# Patient Record
Sex: Male | Born: 1980
Health system: Southern US, Community
[De-identification: ages and names within clinical notes are randomized; demographics above are authoritative.]

## PROBLEM LIST (undated history)

## (undated) DIAGNOSIS — K859 Acute pancreatitis without necrosis or infection, unspecified: Secondary | ICD-10-CM

## (undated) DIAGNOSIS — R002 Palpitations: Secondary | ICD-10-CM

## (undated) DIAGNOSIS — K219 Gastro-esophageal reflux disease without esophagitis: Secondary | ICD-10-CM

## (undated) DIAGNOSIS — F419 Anxiety disorder, unspecified: Secondary | ICD-10-CM

## (undated) DIAGNOSIS — E785 Hyperlipidemia, unspecified: Secondary | ICD-10-CM

## (undated) DIAGNOSIS — T7840XA Allergy, unspecified, initial encounter: Secondary | ICD-10-CM

## (undated) DIAGNOSIS — J3089 Other allergic rhinitis: Secondary | ICD-10-CM

## (undated) DIAGNOSIS — D229 Melanocytic nevi, unspecified: Secondary | ICD-10-CM

## (undated) DIAGNOSIS — R42 Dizziness and giddiness: Secondary | ICD-10-CM

## (undated) DIAGNOSIS — D039 Melanoma in situ, unspecified: Secondary | ICD-10-CM

## (undated) DIAGNOSIS — R079 Chest pain, unspecified: Secondary | ICD-10-CM

## (undated) HISTORY — DX: Anxiety disorder, unspecified: F41.9

## (undated) HISTORY — DX: Allergy, unspecified, initial encounter: T78.40XA

## (undated) HISTORY — DX: Acute pancreatitis without necrosis or infection, unspecified: K85.90

## (undated) HISTORY — DX: Palpitations: R00.2

## (undated) HISTORY — PX: WISDOM TOOTH EXTRACTION: SHX21

## (undated) HISTORY — DX: Chest pain, unspecified: R07.9

## (undated) HISTORY — DX: Gastro-esophageal reflux disease without esophagitis: K21.9

## (undated) HISTORY — DX: Other allergic rhinitis: J30.89

## (undated) HISTORY — DX: Dizziness and giddiness: R42

## (undated) HISTORY — DX: Hyperlipidemia, unspecified: E78.5

---
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
## 2004-12-30 ENCOUNTER — Emergency Department (HOSPITAL_COMMUNITY): Admission: EM | Admit: 2004-12-30 | Discharge: 2004-12-30 | Payer: Self-pay | Admitting: Emergency Medicine

## 2005-08-18 ENCOUNTER — Ambulatory Visit: Payer: Self-pay | Admitting: Family Medicine

## 2007-01-06 ENCOUNTER — Ambulatory Visit (HOSPITAL_COMMUNITY): Admission: RE | Admit: 2007-01-06 | Discharge: 2007-01-06 | Payer: Self-pay | Admitting: Orthopedic Surgery

## 2008-08-02 ENCOUNTER — Encounter: Payer: Self-pay | Admitting: Family Medicine

## 2009-07-11 ENCOUNTER — Ambulatory Visit: Payer: Self-pay | Admitting: Family Medicine

## 2009-09-10 ENCOUNTER — Ambulatory Visit: Payer: Self-pay | Admitting: Family Medicine

## 2009-10-22 ENCOUNTER — Encounter: Payer: Self-pay | Admitting: Family Medicine

## 2010-04-15 ENCOUNTER — Encounter: Payer: Self-pay | Admitting: Family Medicine

## 2010-06-11 NOTE — Letter (Signed)
Summary: Records from [facility] 2004 - 2010  Records from [facility] 2004 - 2010   Imported By: Maryln Gottron 07/23/2009 11:00:32  _____________________________________________________________________  External Attachment:    Type:   Image     Comment:   External Document

## 2010-06-11 NOTE — Assessment & Plan Note (Signed)
Summary: ear issues//ccm   Vital Signs:  Patient profile:   30 year old male Weight:      179 pounds BMI:     24.36 Temp:     98.3 degrees F oral Pulse rate:   84 / minute Pulse rhythm:   regular BP sitting:   140 / 70  (left arm) Cuff size:   regular  Vitals Entered By: Raechel Ache, RN (Sep 10, 2009 4:16 PM) CC: C/o pulsing noise R ear.   History of Present Illness: Here complaining of 3 days of intermittent sounds in his right ear that sound like heartbeats. There is no pain or discomfort. No change in hearing. He admits to some sinus pressure and head congestion for the past month. He is taking Xyzal regularly but does not take any decongestants. No dizziness. No cough or ST or HA or fever.   Allergies: No Known Drug Allergies  Past History:  Past Medical History: Reviewed history from 07/11/2009 and no changes required. Chickenpox Allergic rhinitis, sees Dr. Sidney Ace intermittent musculoskeletal chest pains  Past Surgical History: Reviewed history from 07/11/2009 and no changes required. Wisdom teeth removed - 1997  Review of Systems  The patient denies anorexia, fever, weight loss, weight gain, vision loss, decreased hearing, hoarseness, chest pain, syncope, dyspnea on exertion, peripheral edema, prolonged cough, headaches, hemoptysis, abdominal pain, melena, hematochezia, severe indigestion/heartburn, hematuria, incontinence, genital sores, muscle weakness, suspicious skin lesions, transient blindness, difficulty walking, depression, unusual weight change, abnormal bleeding, enlarged lymph nodes, angioedema, breast masses, and testicular masses.    Physical Exam  General:  Well-developed,well-nourished,in no acute distress; alert,appropriate and cooperative throughout examination Head:  Normocephalic and atraumatic without obvious abnormalities. No apparent alopecia or balding. Eyes:  No corneal or conjunctival inflammation noted. EOMI. Perrla. Funduscopic  exam benign, without hemorrhages, exudates or papilledema. Vision grossly normal. Ears:  External ear exam shows no significant lesions or deformities.  Otoscopic examination reveals clear canals, tympanic membranes are intact bilaterally without bulging, retraction, inflammation or discharge. Hearing is grossly normal bilaterally. Nose:  External nasal examination shows no deformity or inflammation. Nasal mucosa are pink and moist without lesions or exudates. Mouth:  Oral mucosa and oropharynx without lesions or exudates.  Teeth in good repair. Neck:  No deformities, masses, or tenderness noted. Lungs:  Normal respiratory effort, chest expands symmetrically. Lungs are clear to auscultation, no crackles or wheezes. Heart:  Normal rate and regular rhythm. S1 and S2 normal without gallop, murmur, click, rub or other extra sounds.   Impression & Recommendations:  Problem # 1:  EUSTACHIAN TUBE DYSFUNCTION (ICD-381.81)  Problem # 2:  ALLERGIC RHINITIS (ICD-477.9)  His updated medication list for this problem includes:    Xyzal 5 Mg Tabs (Levocetirizine dihydrochloride) ..... Once daily  Complete Medication List: 1)  Xyzal 5 Mg Tabs (Levocetirizine dihydrochloride) .... Once daily  Patient Instructions: 1)  This is simply amplified sounds in the ear from the pulsations of the temporal artery branches. This is a result of head congestion, related to his allergies. Suggested he add Mucinex and /or Sudafed to the Xyzal daily unitl the pollen dies down.  2)  Please schedule a follow-up appointment as needed .

## 2010-06-11 NOTE — Letter (Signed)
Summary: [facility]  [facility]   Imported By: Maryln Gottron 10/31/2009 13:31:26  _____________________________________________________________________  External Attachment:    Type:   Image     Comment:   External Document

## 2010-06-11 NOTE — Assessment & Plan Note (Signed)
Summary: PT TO RE-ESTABLISH/CJR   Vital Signs:  Patient profile:   30 year old male Height:      72 inches Weight:      178 pounds BMI:     24.23 Temp:     98.4 degrees F oral Pulse rate:   68 / minute BP sitting:   132 / 80  (left arm) Cuff size:   regular  Vitals Entered By: Alfred Levins, CMA (July 11, 2009 10:43 AM) CC: re-establish, chest hurts when he inhales, lt earache   History of Present Illness: 30 yr old male to re-establish with [place] after an absence of about 4 years. He had seen Dr. Rosezetta Schlatter for awhile at [facility]. For about 10 years now he has episodes of mild sharp chest pains which last anywhere from several days to a week at a time. he gets these spells about 2 or 3 times a year. He only notices these when he takes a deep breath at rest or when he is lyiong quietly in bed. These are not related to exertion in any way. No cough or SOB or nausea or sweats or heartburn. Most of these pains occur over the spine between the shoulder blades or along the sternum. He may take some Advil for it or maybe nothing. Today he feels fine. He actually saw [facility] for this 2 years ago, and they worked it up with a CXR and an MRI of his spine. Nothing showed up apparently. The other question he has for me today is about an occasional clicking or swishing sound he gets in his left ear. There is no pain and  no drainage, and it does not affect his hearing. he ses Dr. Norco Callas for allergies, and he takes Xyzal and allergy shots for this.   Preventive Screening-Counseling & Management  Alcohol-Tobacco     Smoking Status: never      Drug Use:  no.    Current Medications (verified): 1)  Xyzal 5 Mg  Tabs (Levocetirizine Dihydrochloride) .... Once Daily  Allergies (verified): No Known Drug Allergies  Past History:  Past Medical History: Chickenpox Allergic rhinitis, sees Dr. Sidney Ace intermittent musculoskeletal chest pains  Past Surgical History: Wisdom teeth  removed - 1997  Family History: Reviewed history and no changes required. Graves' disease Family History of Asthma Family History of Prostate CA-Father  Social History: Reviewed history and no changes required. Married Never Smoked Alcohol use-yes Drug use-no Smoking Status:  never Drug Use:  no  Review of Systems  The patient denies anorexia, fever, weight loss, weight gain, vision loss, decreased hearing, hoarseness, syncope, dyspnea on exertion, peripheral edema, prolonged cough, headaches, hemoptysis, abdominal pain, melena, hematochezia, severe indigestion/heartburn, hematuria, incontinence, genital sores, muscle weakness, suspicious skin lesions, transient blindness, difficulty walking, depression, unusual weight change, abnormal bleeding, enlarged lymph nodes, angioedema, breast masses, and testicular masses.    Physical Exam  General:  Well-developed,well-nourished,in no acute distress; alert,appropriate and cooperative throughout examination Head:  Normocephalic and atraumatic without obvious abnormalities. No apparent alopecia or balding. Eyes:  No corneal or conjunctival inflammation noted. EOMI. Perrla. Funduscopic exam benign, without hemorrhages, exudates or papilledema. Vision grossly normal. Ears:  External ear exam shows no significant lesions or deformities.  Otoscopic examination reveals clear canals, tympanic membranes are intact bilaterally without bulging, retraction, inflammation or discharge. Hearing is grossly normal bilaterally. Nose:  External nasal examination shows no deformity or inflammation. Nasal mucosa are pink and moist without lesions or exudates. Mouth:  Oral mucosa  and oropharynx without lesions or exudates.  Teeth in good repair. Neck:  No deformities, masses, or tenderness noted. Chest Wall:  No deformities, masses, tenderness or gynecomastia noted. Lungs:  Normal respiratory effort, chest expands symmetrically. Lungs are clear to auscultation,  no crackles or wheezes. Heart:  Normal rate and regular rhythm. S1 and S2 normal without gallop, murmur, click, rub or other extra sounds. Msk:  No deformity or scoliosis noted of thoracic or lumbar spine.     Impression & Recommendations:  Problem # 1:  CHEST WALL PAIN, ACUTE (ICD-786.52)  Problem # 2:  EUSTACHIAN TUBE DYSFUNCTION (ICD-381.81)  Complete Medication List: 1)  Xyzal 5 Mg Tabs (Levocetirizine dihydrochloride) .... Once daily  Patient Instructions: 1)  I reassured him that the chest wall pain is most certainly benign and that I do not think any further workup is warranted. Suggested he start taking Aleeve two times a day on a regular basis for the next 3 months to see if this helps. As for the ear symptoms, I  also explained this was benign and that decongestants can help. In addition to his antihistamine, he could try Mucinex or Sudafed.  2)  Please schedule a follow-up appointment as needed .

## 2010-06-13 NOTE — Letter (Signed)
Summary: [facility]  [facility]   Imported By: Maryln Gottron 05/07/2010 12:08:42  _____________________________________________________________________  External Attachment:    Type:   Image     Comment:   External Document

## 2010-07-05 ENCOUNTER — Encounter: Payer: Self-pay | Admitting: Family Medicine

## 2010-07-05 ENCOUNTER — Ambulatory Visit (INDEPENDENT_AMBULATORY_CARE_PROVIDER_SITE_OTHER): Payer: BC Managed Care – PPO | Admitting: Family Medicine

## 2010-07-05 VITALS — BP 120/80 | HR 84 | Temp 98.8°F

## 2010-07-05 DIAGNOSIS — J329 Chronic sinusitis, unspecified: Secondary | ICD-10-CM

## 2010-07-05 MED ORDER — AZITHROMYCIN 250 MG PO TABS: ORAL_TABLET | ORAL | Status: AC

## 2010-07-05 NOTE — Progress Notes (Signed)
  Subjective:    Patient ID: Raymond Bryant, male    DOB: 1981-04-08, 30 y.o.   MRN: [id number]  HPI Here for 2 weeks of sinus pressure, left ear pain, PND, and as dry cough. No fever. On Xyzal and Mucinex/   Review of Systems  Constitutional: Negative.   HENT: Positive for ear pain, congestion, postnasal drip and sinus pressure.   Eyes: Negative.   Respiratory: Positive for cough.        Objective:   Physical Exam  Constitutional: He appears well-developed and well-nourished.  HENT:  Head: Normocephalic and atraumatic.  Right Ear: External ear normal.  Left Ear: External ear normal.  Nose: Nose normal.  Mouth/Throat: Oropharynx is clear and moist. No oropharyngeal exudate.  Eyes: Conjunctivae and EOM are normal. Pupils are equal, round, and reactive to light.  Cardiovascular: Normal rate, regular rhythm, normal heart sounds and intact distal pulses.  Exam reveals no gallop and no friction rub.   No murmur heard. Lymphadenopathy:    He has no cervical adenopathy.          Assessment & Plan:  He has a mild sinusitis on top of his usual allergies.

## 2010-10-02 ENCOUNTER — Encounter: Payer: Self-pay | Admitting: Family Medicine

## 2010-10-02 ENCOUNTER — Ambulatory Visit (INDEPENDENT_AMBULATORY_CARE_PROVIDER_SITE_OTHER): Payer: BC Managed Care – PPO | Admitting: Family Medicine

## 2010-10-02 VITALS — BP 110/66 | HR 82 | Temp 98.4°F | Resp 14 | Wt 169.0 lb

## 2010-10-02 DIAGNOSIS — K859 Acute pancreatitis without necrosis or infection, unspecified: Secondary | ICD-10-CM

## 2010-10-02 LAB — AMYLASE: Amylase: 182 U/L — ABNORMAL HIGH (ref 27–131)

## 2010-10-02 NOTE — Progress Notes (Signed)
  Subjective:    Patient ID: Raymond Bryant, male    DOB: Feb 20, 1981, 30 y.o.   MRN: [id number]  HPI Here to follow up an episode of alcoholic pancreatitis last week. He had been drinking a lot more alcohol than [place] is used to on [place], and he began to experience abdominal pain and non-stop vomiting. The vomitus was brown in color, not red or black. He did not pass out but was very intoxicated. His wife took him to [facility] ER in [place], where he got IV fluids. Labs were all normal except for a very high lipase at 2103 and a high amylase (no level was recorded on the sheet he shows me). He was sent home, and he has done very well since then. He slowly advanced his diet over the next few days until now he is eating a regular diet with no nausea or pain at all. Prior to this episode he had not had any abdominal symptoms, no heartburn, etc.    Review of Systems  Constitutional: Negative.   Respiratory: Negative.   Cardiovascular: Negative.   Gastrointestinal: Positive for nausea, vomiting and abdominal pain.       Objective:   Physical Exam  Constitutional: He appears well-developed and well-nourished.  Cardiovascular: Normal rate, regular rhythm, normal heart sounds and intact distal pulses.   Pulmonary/Chest: Effort normal and breath sounds normal.  Abdominal: Soft. Bowel sounds are normal. He exhibits no distension and no mass. There is no tenderness. There is no rebound and no guarding.          Assessment & Plan:  This was an episode of alcoholic pancreatitis that has apparently resolved. We will recheck some enzyme levels today

## 2010-10-08 NOTE — Progress Notes (Signed)
Pt informed

## 2010-10-23 ENCOUNTER — Ambulatory Visit (INDEPENDENT_AMBULATORY_CARE_PROVIDER_SITE_OTHER): Payer: BC Managed Care – PPO | Admitting: Family Medicine

## 2010-10-23 ENCOUNTER — Encounter: Payer: Self-pay | Admitting: Family Medicine

## 2010-10-23 VITALS — BP 140/80 | Temp 99.1°F | Wt 168.0 lb

## 2010-10-23 DIAGNOSIS — R5383 Other fatigue: Secondary | ICD-10-CM

## 2010-10-23 DIAGNOSIS — F3289 Other specified depressive episodes: Secondary | ICD-10-CM

## 2010-10-23 DIAGNOSIS — R5381 Other malaise: Secondary | ICD-10-CM

## 2010-10-23 DIAGNOSIS — F32A Depression, unspecified: Secondary | ICD-10-CM

## 2010-10-23 DIAGNOSIS — F329 Major depressive disorder, single episode, unspecified: Secondary | ICD-10-CM

## 2010-10-23 NOTE — Patient Instructions (Signed)
Get back to regular exercise as much as possible. Follow up with Dr Clent Ridges for any progressive depression symptoms.

## 2010-10-23 NOTE — Progress Notes (Signed)
  Subjective:    Patient ID: Raymond Bryant, male    DOB: 1980/10/06, 30 y.o.   MRN: [id number]  HPI Patient seen with initial complaint of anxiety symptoms. He relates newborn son about 9 months ago. Since then has had some intermittent relatively mild depression symptoms that come and go. Big change in life routine. Has given up several hobbies. Not exercising consistently. Increased fatigue. Generally getting plenty of sleep. No change of appetite. Decreased time and interest in hobbies. Occasional difficulty concentrating. No appetite or weight changes.  Mother has hypothyroidism. He is concerned this should be screened. No constipation issues or any cold intolerance. No prior history of depression. No specific work stressors.   Review of Systems  Constitutional: Negative for appetite change and unexpected weight change.  Respiratory: Negative for shortness of breath.   Cardiovascular: Negative for chest pain.  Neurological: Negative for headaches.  Psychiatric/Behavioral: Positive for dysphoric mood. Negative for suicidal ideas, sleep disturbance, self-injury and agitation.       Objective:   Physical Exam  Constitutional: He is oriented to person, place, and time. He appears well-developed and well-nourished.  Cardiovascular: Normal rate and regular rhythm.   Pulmonary/Chest: Effort normal and breath sounds normal. No respiratory distress. He has no wheezes.  Musculoskeletal: He exhibits no edema.  Neurological: He is alert and oriented to person, place, and time.  Psychiatric: He has a normal mood and affect. His behavior is normal.          Assessment & Plan:  Depressed mood. Doubt major depressive episode. Check TSH. Get back to regular exercise routine. Consider counseling. If anxiety or depressive symptoms worsen consider sertraline

## 2010-10-24 LAB — TSH: TSH: 0.54 u[IU]/mL (ref 0.35–5.50)

## 2010-10-25 NOTE — Progress Notes (Signed)
Quick Note:  Pt informed ______ 

## 2010-11-28 ENCOUNTER — Encounter: Payer: Self-pay | Admitting: Family Medicine

## 2010-11-28 ENCOUNTER — Ambulatory Visit (INDEPENDENT_AMBULATORY_CARE_PROVIDER_SITE_OTHER): Payer: BC Managed Care – PPO | Admitting: Family Medicine

## 2010-11-28 VITALS — BP 124/78 | HR 107 | Temp 98.6°F | Wt 165.0 lb

## 2010-11-28 DIAGNOSIS — F3289 Other specified depressive episodes: Secondary | ICD-10-CM

## 2010-11-28 DIAGNOSIS — K859 Acute pancreatitis without necrosis or infection, unspecified: Secondary | ICD-10-CM

## 2010-11-28 DIAGNOSIS — F32A Depression, unspecified: Secondary | ICD-10-CM

## 2010-11-28 DIAGNOSIS — F411 Generalized anxiety disorder: Secondary | ICD-10-CM

## 2010-11-28 DIAGNOSIS — F329 Major depressive disorder, single episode, unspecified: Secondary | ICD-10-CM

## 2010-11-28 DIAGNOSIS — F419 Anxiety disorder, unspecified: Secondary | ICD-10-CM

## 2010-11-28 LAB — HEPATIC FUNCTION PANEL
AST: 21 U/L (ref 0–37)
Albumin: 5.2 g/dL (ref 3.5–5.2)
Total Bilirubin: 0.7 mg/dL (ref 0.3–1.2)

## 2010-11-28 LAB — AMYLASE: Amylase: 54 U/L (ref 27–131)

## 2010-11-28 LAB — LIPASE: Lipase: 26 U/L (ref 11.0–59.0)

## 2010-11-28 MED ORDER — SERTRALINE HCL 50 MG PO TABS: 50.0000 mg | ORAL_TABLET | Freq: Every day | ORAL | Status: DC

## 2010-11-28 NOTE — Progress Notes (Signed)
  Subjective:    Patient ID: Raymond Bryant, male    DOB: Feb 14, 1981, 30 y.o.   MRN: [id number]  HPI Here to discuss anxiety and depression and to follow up on pancreatitis. He was treated for pancreatitis 2 months ago, and we saw that his enzyms had dramatically dropped over a period of one week, but that they were not down to normal. He wants to recheck these today. He has no abdominal complaints. He has dealt with a lot of stress lately with a newborn child at home and a possible job change looming. He worries, gets sad, and has trouble sleeping.    Review of Systems  Constitutional: Negative.   Gastrointestinal: Negative.   Psychiatric/Behavioral: Positive for dysphoric mood.       Objective:   Physical Exam  Constitutional: He appears well-developed and well-nourished.  Abdominal: Soft. Bowel sounds are normal. He exhibits no distension and no mass. There is no tenderness. There is no rebound and no guarding.  Psychiatric: He has a normal mood and affect. His behavior is normal. Judgment and thought content normal.          Assessment & Plan:  Get labs today. Try Zoloft. Recheck in one month

## 2010-12-05 ENCOUNTER — Telehealth: Payer: Self-pay | Admitting: Family Medicine

## 2010-12-05 NOTE — Telephone Encounter (Signed)
Left voice message with results.

## 2010-12-05 NOTE — Telephone Encounter (Signed)
Message copied by Baldemar Friday on Thu Dec 05, 2010  5:11 PM ------      Message from: Gershon Crane A      Created: Tue Dec 03, 2010  8:32 AM       All his liver and pancreas enzymes are back to normal levels

## 2010-12-09 ENCOUNTER — Telehealth: Payer: Self-pay | Admitting: Family Medicine

## 2010-12-09 NOTE — Telephone Encounter (Signed)
Requesting lab results from last week. Thanks.

## 2010-12-09 NOTE — Telephone Encounter (Signed)
patient  Is aware of lab results 

## 2010-12-18 ENCOUNTER — Encounter: Payer: Self-pay | Admitting: Family Medicine

## 2010-12-18 ENCOUNTER — Ambulatory Visit (INDEPENDENT_AMBULATORY_CARE_PROVIDER_SITE_OTHER): Payer: BC Managed Care – PPO | Admitting: Family Medicine

## 2010-12-18 VITALS — BP 110/68 | HR 77 | Temp 98.6°F | Wt 168.0 lb

## 2010-12-18 DIAGNOSIS — F411 Generalized anxiety disorder: Secondary | ICD-10-CM

## 2010-12-18 DIAGNOSIS — F419 Anxiety disorder, unspecified: Secondary | ICD-10-CM

## 2010-12-18 NOTE — Progress Notes (Signed)
  Subjective:    Patient ID: Raymond Bryant, male    DOB: 1981/02/12, 30 y.o.   MRN: [id number]  HPI Here to follow up on anxiety. We met one month ago to discuss his problems with daily anxiety, and i suggested he try Zoloft. His wife talked him out of it however, and he never started it. He asks my advice again today. He still deals with daily nervousness, worrying about everything, has trouble sleeping, etc.    Review of Systems  Constitutional: Negative.   Psychiatric/Behavioral: Positive for sleep disturbance. Negative for dysphoric mood and decreased concentration. The patient is nervous/anxious.        Objective:   Physical Exam  Constitutional: He appears well-developed and well-nourished.  Psychiatric: He has a normal mood and affect. His behavior is normal. Judgment and thought content normal.          Assessment & Plan:  He still has anxiety issues, and I think an agent like Zoloft could help tremendously. I urged him to try it, and he said he would.

## 2010-12-26 ENCOUNTER — Telehealth: Payer: Self-pay | Admitting: Family Medicine

## 2010-12-26 MED ORDER — SERTRALINE HCL 50 MG PO TABS: 50.0000 mg | ORAL_TABLET | Freq: Every day | ORAL | Status: DC

## 2010-12-26 NOTE — Telephone Encounter (Signed)
rx sent in electronically 

## 2010-12-26 NOTE — Telephone Encounter (Signed)
Pt called and said that [facility] [address] [facility] for approx 1 month now and was told by pharmacy that the sertraline (ZOLOFT) 50 MG tablet script was never rcvd from Dr Clent Ridges. Pls call in to pharmacy today asap.

## 2011-02-18 ENCOUNTER — Telehealth: Payer: Self-pay | Admitting: *Deleted

## 2011-02-18 DIAGNOSIS — R430 Anosmia: Secondary | ICD-10-CM

## 2011-02-18 NOTE — Telephone Encounter (Signed)
Pt is still having a problem with his sense of smell, and wants to know who to see for this???

## 2011-02-18 NOTE — Telephone Encounter (Signed)
I suggest he see ENT. We can do a referral if he wishes

## 2011-02-19 NOTE — Telephone Encounter (Signed)
Left detailed message on pt's personal voice mail re: referral.

## 2011-03-05 ENCOUNTER — Telehealth: Payer: Self-pay | Admitting: *Deleted

## 2011-03-05 NOTE — Telephone Encounter (Signed)
I look up pt past labs and actually it was his amylase and lipase that was elevated and then returned to normal

## 2011-03-05 NOTE — Telephone Encounter (Signed)
Pt states he is on zoloft since about august  and now is dizzy at times and have gi problems(abnormal stools) and loosing weight. Pt states he had abnormal lfts in may before he started zoloft but they returned to normal.Ov???

## 2011-03-06 NOTE — Telephone Encounter (Signed)
He needs an OV for this  

## 2011-03-06 NOTE — Telephone Encounter (Signed)
Can  You please call pt and tell him dr fry wants to discuss this with him at ov and make ov? thanks

## 2011-03-11 ENCOUNTER — Encounter: Payer: Self-pay | Admitting: Family Medicine

## 2011-03-11 ENCOUNTER — Ambulatory Visit (INDEPENDENT_AMBULATORY_CARE_PROVIDER_SITE_OTHER): Payer: BC Managed Care – PPO | Admitting: Family Medicine

## 2011-03-11 VITALS — BP 112/70 | HR 79 | Temp 98.5°F | Wt 161.0 lb

## 2011-03-11 DIAGNOSIS — R42 Dizziness and giddiness: Secondary | ICD-10-CM

## 2011-03-11 DIAGNOSIS — F419 Anxiety disorder, unspecified: Secondary | ICD-10-CM

## 2011-03-11 DIAGNOSIS — F411 Generalized anxiety disorder: Secondary | ICD-10-CM

## 2011-03-11 NOTE — Progress Notes (Signed)
  Subjective:    Patient ID: Raymond Bryant, male    DOB: [birth year], 30 y.o.   MRN: [id number]  HPI Here to discuss some lightheadedness, dizziness, and loose stools he has had for several months. No fever or nausea. No allergy or sinus symptoms. He started on Zoloft in August for anxiety, and this has been very helpful. He now feels very little anxiety at all.    Review of Systems  Constitutional: Negative.   HENT: Negative.   Respiratory: Negative.   Cardiovascular: Negative.   Neurological: Positive for dizziness. Negative for headaches.  Psychiatric/Behavioral: Negative.        Objective:   Physical Exam  Constitutional: He is oriented to person, place, and time. He appears well-developed and well-nourished.  HENT:  Right Ear: External ear normal.  Left Ear: External ear normal.  Nose: Nose normal.  Mouth/Throat: No oropharyngeal exudate.  Eyes: Conjunctivae are normal. Pupils are equal, round, and reactive to light.  Neck: No thyromegaly present.  Lymphadenopathy:    He has no cervical adenopathy.  Neurological: He is alert and oriented to person, place, and time. No cranial nerve deficit.  Psychiatric: He has a normal mood and affect. His behavior is normal. Thought content normal.          Assessment & Plan:  I think he is having side effects of Zoloft. He says he no longer needs this med so we will stop it. He will take 1/2 tab daily for one week and then stop. Recheck in 3 weeks

## 2011-03-17 ENCOUNTER — Other Ambulatory Visit: Payer: Self-pay | Admitting: Family Medicine

## 2011-03-17 NOTE — Telephone Encounter (Signed)
Pt last seen 03/11/11.  Pls advise.  

## 2011-12-10 ENCOUNTER — Encounter: Payer: Self-pay | Admitting: Family Medicine

## 2011-12-10 ENCOUNTER — Ambulatory Visit (INDEPENDENT_AMBULATORY_CARE_PROVIDER_SITE_OTHER): Payer: BC Managed Care – PPO | Admitting: Family Medicine

## 2011-12-10 VITALS — BP 124/70 | HR 69 | Temp 98.7°F | Wt 172.0 lb

## 2011-12-10 DIAGNOSIS — F411 Generalized anxiety disorder: Secondary | ICD-10-CM

## 2011-12-10 DIAGNOSIS — R079 Chest pain, unspecified: Secondary | ICD-10-CM

## 2011-12-10 DIAGNOSIS — F419 Anxiety disorder, unspecified: Secondary | ICD-10-CM

## 2011-12-10 MED ORDER — LORAZEPAM 0.5 MG PO TABS: 0.5000 mg | ORAL_TABLET | Freq: Three times a day (TID) | ORAL | Status: AC | PRN

## 2011-12-10 NOTE — Progress Notes (Signed)
  Subjective:    Patient ID: Raymond Bryant, male    DOB: 11/25/1980, 31 y.o.   MRN: [id number]  HPI Here to follow up on anxiety and to ask about some mild brief chest sensations he has had in the past month. These feel like a pressure sensation and not a pain. They are not severe. They are sharp in nature. No SOB. He denies much heartburn or trouble swallowing. These feelings are brief lasting only a second or two.  They are not associated with exeertion. He tried Zoloft last year with poor results. This did not help his anxiety so he stopped it.    Review of Systems  Constitutional: Negative.   Respiratory: Negative.   Cardiovascular: Positive for chest pain. Negative for palpitations and leg swelling.  Gastrointestinal: Negative.   Psychiatric/Behavioral: Negative.        Objective:   Physical Exam  Constitutional: He is oriented to person, place, and time. He appears well-developed and well-nourished.  Neck: No thyromegaly present.  Cardiovascular: Normal rate, regular rhythm, normal heart sounds and intact distal pulses.   Pulmonary/Chest: Effort normal and breath sounds normal. No respiratory distress. He has no wheezes. He has no rales. He exhibits no tenderness.  Lymphadenopathy:    He has no cervical adenopathy.  Neurological: He is alert and oriented to person, place, and time.  Psychiatric: He has a normal mood and affect. His behavior is normal. Thought content normal.          Assessment & Plan:  His chest symptoms seem to be manifestations of stress although GERD may play a role. Try Lorazepam to use on a prn basis.

## 2011-12-12 ENCOUNTER — Telehealth: Payer: Self-pay | Admitting: Family Medicine

## 2011-12-12 NOTE — Telephone Encounter (Signed)
I spoke with pt  

## 2011-12-12 NOTE — Telephone Encounter (Signed)
Please tell him there are NO side effects to worry about despite what he might have read. These are okay to take together

## 2011-12-12 NOTE — Telephone Encounter (Signed)
Caller: Raymond Bryant/Patient is calling with a question about Lorazepam that was prescribed to him by Dr. Clent Ridges.  He forgot to mention that he is also taking  Astepro. He reviewed the medication and is concerned About Drug Interactions.  Since there are drug interactions , should he take this or is there another alternative? He has not picked the medication up yet.   Reviewed Health Education on both drugs.  Advised caller that I would forward questions to his PCP for review. Also, encouraged him to speak with pharmacist for guidance as well.  Patient may be reached on Cell [phone]

## 2012-01-06 ENCOUNTER — Telehealth: Payer: Self-pay | Admitting: Family Medicine

## 2012-01-06 NOTE — Telephone Encounter (Signed)
Caller: Dagoberto/Patient; Patient Name: Raymond Bryant; PCP: [facility] Crane [facility] Best Callback Phone Number: [phone].  Call regarding Lorazapam 0.5mg  follow up for anxiety.  Patient wants to know if he could take 1mg  at times of anxiety attack due to during anxiety attack, Patient feels anxiety is under control but the physical symptoms don't improve, i.e. pain under arm and chest.  Patient denies anxiety or chest pain at this time.  Patient was seen in office on 7-31, diagnosed with Anxiety and prescribed Lorazepam.  [facility] [place], [phone].  All emergent symptoms ruled out per Anxiety Protocol, see in 72 hours.  Please follow up with Patient if 1mg  may be taken or if MD wants to change script.

## 2012-01-07 NOTE — Telephone Encounter (Signed)
Tell him that he may take 2 pills at a time once in awhile as needed

## 2012-01-07 NOTE — Telephone Encounter (Signed)
I left voice message with below information. 

## 2012-06-18 ENCOUNTER — Telehealth: Payer: Self-pay | Admitting: Family Medicine

## 2012-06-18 NOTE — Telephone Encounter (Signed)
He needs an OV for this  

## 2012-06-18 NOTE — Telephone Encounter (Signed)
Patient Information:  Caller Name: Elek  Phone: [phone]  Patient: Raymond Bryant, Raymond Bryant  Gender: Male  DOB: 1980/07/10  Age: 32 Years  PCP: Gershon Crane [facility])  Office Follow Up:  Does the office need to follow up with this patient?: Yes  Instructions For The Office: pushing for Tamiflu, guidelines explained but he is insistent  RN Note:  Patient insistent that his wife got sick at the same time and went to her MD this week, 2/5 and got Tamiflu.   To push fluids. [facility] [place] [facility] [address]  Symptoms  Reason For Call & Symptoms: Has post nasal drip, sore throat and a cough and now neck pain/tightness at the base of his skull.  Wife is being treated for the flu and he is asking for Tamiflu.   Denies any body aches.  Reviewed Health History In EMR: N/A  Reviewed Medications In EMR: N/A  Reviewed Allergies In EMR: Yes  Reviewed Surgeries / Procedures: Yes  Date of Onset of Symptoms: 06/11/2012  Treatments Tried: Zycam, Motrin Sinus  Treatments Tried Worked: No  Guideline(s) Used:  Influenza - Seasonal  Disposition Per Guideline:   Home Care  Reason For Disposition Reached:   Probable influenza with no complications and not HIGH RISK  Advice Given:  N/A

## 2012-06-18 NOTE — Telephone Encounter (Signed)
I spoke with pt and advised to schedule a office visit.

## 2012-09-24 ENCOUNTER — Other Ambulatory Visit: Payer: Self-pay | Admitting: Family Medicine

## 2012-09-24 NOTE — Telephone Encounter (Signed)
Call in #60 with 2 rf 

## 2012-10-12 ENCOUNTER — Ambulatory Visit (INDEPENDENT_AMBULATORY_CARE_PROVIDER_SITE_OTHER): Payer: BC Managed Care – PPO | Admitting: Family Medicine

## 2012-10-12 ENCOUNTER — Encounter: Payer: Self-pay | Admitting: Family Medicine

## 2012-10-12 VITALS — BP 116/64 | HR 70 | Temp 98.1°F | Ht 71.5 in | Wt 164.0 lb

## 2012-10-12 DIAGNOSIS — Z0000 Encounter for general adult medical examination without abnormal findings: Secondary | ICD-10-CM

## 2012-10-12 LAB — POCT URINALYSIS DIPSTICK
Blood, UA: NEGATIVE
Leukocytes, UA: NEGATIVE
Nitrite, UA: NEGATIVE
Urobilinogen, UA: 0.2
pH, UA: 7

## 2012-10-12 LAB — LIPID PANEL
HDL: 40.7 mg/dL (ref >39.00)
Triglycerides: 60 mg/dL (ref 0.0–149.0)

## 2012-10-12 LAB — HEPATIC FUNCTION PANEL
AST: 17 U/L (ref 0–37)
Albumin: 4.5 g/dL (ref 3.5–5.2)
Alkaline Phosphatase: 36 U/L — ABNORMAL LOW (ref 39–117)
Total Protein: 7.8 g/dL (ref 6.0–8.3)

## 2012-10-12 LAB — BASIC METABOLIC PANEL
BUN: 13 mg/dL (ref 6–23)
CO2: 28 mEq/L (ref 19–32)
Calcium: 9.2 mg/dL (ref 8.4–10.5)
GFR: 111.52 mL/min (ref >60.00)
Glucose, Bld: 101 mg/dL — ABNORMAL HIGH (ref 70–99)
Potassium: 3.6 mEq/L (ref 3.5–5.1)
Sodium: 138 mEq/L (ref 135–145)

## 2012-10-12 LAB — CBC WITH DIFFERENTIAL/PLATELET
Basophils Absolute: 0.1 10*3/uL (ref 0.0–0.1)
Eosinophils Absolute: 0.2 10*3/uL (ref 0.0–0.7)
Lymphocytes Relative: 34.2 % (ref 12.0–46.0)
MCHC: 33.6 g/dL (ref 30.0–36.0)
MCV: 88.9 fl (ref 78.0–100.0)
Monocytes Absolute: 0.7 10*3/uL (ref 0.1–1.0)
Neutrophils Relative %: 50.1 % (ref 43.0–77.0)
Platelets: 289 10*3/uL (ref 150.0–400.0)
RDW: 13.1 % (ref 11.5–14.6)

## 2012-10-12 LAB — LDL CHOLESTEROL, DIRECT: Direct LDL: 179.6 mg/dL

## 2012-10-12 NOTE — Progress Notes (Signed)
  Subjective:    Patient ID: Raymond Bryant, male    DOB: [birth year], 32 y.o.   MRN: [id number]  HPI 32 yr old male for a cpx. He feels well except for some occasional pains around the ankles and lower legs, especially if he is on his feet a lot. He runs for exercise about 2 days a week. No swelling is seen.    Review of Systems  Constitutional: Negative.   HENT: Negative.   Eyes: Negative.   Respiratory: Negative.   Cardiovascular: Negative.   Gastrointestinal: Negative.   Genitourinary: Negative.   Musculoskeletal: Negative.   Skin: Negative.   Neurological: Negative.   Psychiatric/Behavioral: Negative.        Objective:   Physical Exam  Constitutional: He is oriented to person, place, and time. He appears well-developed and well-nourished. No distress.  HENT:  Head: Normocephalic and atraumatic.  Right Ear: External ear normal.  Left Ear: External ear normal.  Nose: Nose normal.  Mouth/Throat: Oropharynx is clear and moist. No oropharyngeal exudate.  Eyes: Conjunctivae and EOM are normal. Pupils are equal, round, and reactive to light. Right eye exhibits no discharge. Left eye exhibits no discharge. No scleral icterus.  Neck: Neck supple. No JVD present. No tracheal deviation present. No thyromegaly present.  Cardiovascular: Normal rate, regular rhythm, normal heart sounds and intact distal pulses.  Exam reveals no gallop and no friction rub.   No murmur heard. Pulmonary/Chest: Effort normal and breath sounds normal. No respiratory distress. He has no wheezes. He has no rales. He exhibits no tenderness.  Abdominal: Soft. Bowel sounds are normal. He exhibits no distension and no mass. There is no tenderness. There is no rebound and no guarding.  Genitourinary: Rectum normal, prostate normal and penis normal. Guaiac negative stool. No penile tenderness.  Musculoskeletal: Normal range of motion. He exhibits no edema and no tenderness.  Lymphadenopathy:    He has no cervical  adenopathy.  Neurological: He is alert and oriented to person, place, and time. He has normal reflexes. No cranial nerve deficit. He exhibits normal muscle tone. Coordination normal.  Skin: Skin is warm and dry. No rash noted. He is not diaphoretic. No erythema. No pallor.  Psychiatric: He has a normal mood and affect. His behavior is normal. Judgment and thought content normal.          Assessment & Plan:  Well exam. He may have some weak arches in his feet, so I suggested he wear gel arch supports inside his shoes daily. Recheck prn

## 2012-10-13 NOTE — Progress Notes (Signed)
Quick Note:  I left voice message with results. ______ 

## 2012-12-01 ENCOUNTER — Telehealth: Payer: Self-pay | Admitting: Family Medicine

## 2012-12-01 NOTE — Telephone Encounter (Signed)
I wanted to recheck his lipids 6 months from his cpx which would be around December

## 2012-12-01 NOTE — Telephone Encounter (Signed)
PT is inquiring about follow up lab work. He states that he was instructed to come back in regards to his cholesterol. Please assist.

## 2012-12-02 NOTE — Telephone Encounter (Signed)
lmovm for pt to cb / ga °

## 2012-12-29 ENCOUNTER — Ambulatory Visit (INDEPENDENT_AMBULATORY_CARE_PROVIDER_SITE_OTHER): Payer: BC Managed Care – PPO | Admitting: Family Medicine

## 2012-12-29 ENCOUNTER — Encounter: Payer: Self-pay | Admitting: Family Medicine

## 2012-12-29 VITALS — BP 122/74 | HR 76 | Temp 98.7°F | Wt 164.0 lb

## 2012-12-29 DIAGNOSIS — K219 Gastro-esophageal reflux disease without esophagitis: Secondary | ICD-10-CM

## 2012-12-29 DIAGNOSIS — F411 Generalized anxiety disorder: Secondary | ICD-10-CM

## 2012-12-29 NOTE — Progress Notes (Signed)
  Subjective:    Patient ID: Raymond Bryant, male    DOB: [birth year], 32 y.o.   MRN: [id number]  HPI Here to discuss intermittent chest tightness as well as heartburn he has noticed for the past few weeks. No trouble swallowing. No SOB. Using TUMS which partially helps. His anxiety has been well controlled with occasional Lorazepam.    Review of Systems  Constitutional: Negative.   Respiratory: Positive for chest tightness. Negative for cough, shortness of breath and wheezing.   Cardiovascular: Negative.   Gastrointestinal: Negative.   Psychiatric/Behavioral: Negative.        Objective:   Physical Exam  Constitutional: He appears well-developed and well-nourished. No distress.  Neck: No thyromegaly present.  Cardiovascular: Normal rate, regular rhythm, normal heart sounds and intact distal pulses.   Pulmonary/Chest: Effort normal and breath sounds normal.  Abdominal: Soft. Bowel sounds are normal. He exhibits no distension and no mass. There is no tenderness. There is no rebound and no guarding.  Lymphadenopathy:    He has no cervical adenopathy.  Psychiatric: He has a normal mood and affect. His behavior is normal. Thought content normal.          Assessment & Plan:  He seems to be having some reflux, so he will try Prilosec OTC. His anxiety is stable.

## 2012-12-30 DIAGNOSIS — K219 Gastro-esophageal reflux disease without esophagitis: Secondary | ICD-10-CM | POA: Insufficient documentation

## 2012-12-30 DIAGNOSIS — F411 Generalized anxiety disorder: Secondary | ICD-10-CM | POA: Insufficient documentation

## 2012-12-30 HISTORY — DX: Gastro-esophageal reflux disease without esophagitis: K21.9

## 2013-03-08 ENCOUNTER — Ambulatory Visit (INDEPENDENT_AMBULATORY_CARE_PROVIDER_SITE_OTHER): Payer: BC Managed Care – PPO | Admitting: Family Medicine

## 2013-03-08 ENCOUNTER — Encounter: Payer: Self-pay | Admitting: Family Medicine

## 2013-03-08 ENCOUNTER — Telehealth: Payer: Self-pay | Admitting: Family Medicine

## 2013-03-08 VITALS — BP 124/66 | HR 88 | Temp 98.8°F | Wt 158.0 lb

## 2013-03-08 DIAGNOSIS — J209 Acute bronchitis, unspecified: Secondary | ICD-10-CM

## 2013-03-08 MED ORDER — AZITHROMYCIN 250 MG PO TABS: ORAL_TABLET | ORAL | Status: DC

## 2013-03-08 NOTE — Progress Notes (Signed)
  Subjective:    Patient ID: Raymond Bryant, male    DOB: May 22, 1980, 32 y.o.   MRN: [id number]  HPI Here for 5 days of sinus pressure, PND, and coughing up rust colored sputum. No fever or chest pain.    Review of Systems  Constitutional: Negative.   HENT: Positive for congestion and postnasal drip.   Eyes: Negative.   Respiratory: Positive for cough.        Objective:   Physical Exam  Constitutional: He appears well-developed and well-nourished.  HENT:  Right Ear: External ear normal.  Left Ear: External ear normal.  Nose: Nose normal.  Mouth/Throat: Oropharynx is clear and moist.  Eyes: Conjunctivae are normal.  Pulmonary/Chest: Effort normal. No respiratory distress. He has no wheezes. He has no rales.  Scattered rhonchi   Lymphadenopathy:    He has no cervical adenopathy.          Assessment & Plan:  Add Mucinex and Delsym

## 2013-03-08 NOTE — Telephone Encounter (Signed)
Patient Information:  Caller Name: Raymond Bryant  Phone: [phone]  Patient: Raymond Bryant, Raymond Bryant  Gender: Male  DOB: 02/26/1981  Age: 32 Years  PCP: Gershon Crane [facility])  Office Follow Up:  Does the office need to follow up with this patient?: No  Instructions For The Office: N/A   Symptoms  Reason For Call & Symptoms: Sore throat with  productive cough, chest congestion and nasal congestion.  Reviewed Health History In EMR: Yes  Reviewed Medications In EMR: Yes  Reviewed Allergies In EMR: Yes  Reviewed Surgeries / Procedures: Yes  Date of Onset of Symptoms: 03/02/2013  Treatments Tried: Robitussin DM, Mucinex DM  Treatments Tried Worked: Yes  Guideline(s) Used:  Cough  Disposition Per Guideline:   See Today in Office  Reason For Disposition Reached:   Coughing up Raymond Bryant-colored (reddish-brown) or blood-tinged sputum  Advice Given:  Reassurance  Coughing is the way that our lungs remove irritants and mucus. It helps protect our lungs from getting pneumonia.  You can get a dry hacking cough after a chest cold. Sometimes this type of cough can last 1-3 weeks, and be worse at night.  Coughing Spasms:  Drink warm fluids. Inhale warm mist (Reason: both relax the airway and loosen up the phlegm).  Suck on cough drops or hard candy to coat the irritated throat.  Prevent Dehydration:  Drink adequate liquids.  This will help soothe an irritated or dry throat and loosen up the phlegm.  Expected Course:   The expected course depends on what is causing the cough.  Viral bronchitis (chest cold) causes a cough that lasts 1 to 3 weeks. Sometimes you may cough up lots of phlegm (sputum, mucus). The mucus can normally be white, gray, yellow, or green.  Call Back If:  Difficulty breathing  Cough lasts more than 3 weeks  Fever lasts > 3 days  You become worse.  Patient Will Follow Care Advice:  YES  Appointment Scheduled:  03/08/2013 15:45:00 Appointment Scheduled Provider:  Gershon Crane [facility])

## 2013-04-21 ENCOUNTER — Other Ambulatory Visit (INDEPENDENT_AMBULATORY_CARE_PROVIDER_SITE_OTHER): Payer: 59

## 2013-04-21 DIAGNOSIS — E785 Hyperlipidemia, unspecified: Secondary | ICD-10-CM

## 2013-04-21 LAB — LIPID PANEL
Cholesterol: 244 mg/dL — ABNORMAL HIGH (ref 0–200)
Total CHOL/HDL Ratio: 6

## 2013-04-27 ENCOUNTER — Other Ambulatory Visit: Payer: Self-pay | Admitting: Family Medicine

## 2013-04-27 ENCOUNTER — Telehealth: Payer: Self-pay | Admitting: Family Medicine

## 2013-04-27 MED ORDER — ATORVASTATIN CALCIUM 20 MG PO TABS: 20.0000 mg | ORAL_TABLET | Freq: Every day | ORAL | Status: DC

## 2013-04-27 NOTE — Telephone Encounter (Signed)
Patient Information:  Caller Name: Raymond Bryant  Phone: [phone]  Patient: Raymond Bryant, Raymond Bryant  Gender: Male  DOB: September 06, 1980  Age: 32 Years  PCP: Raymond Bryant [facility])  Office Follow Up:  Does the office need to follow up with this patient?: No  Instructions For The Office: N/A  RN Note:  Pt reports sinus congestion that has not gone away after a round of Zithromax on 03/08/13.  Symptoms  Reason For Call & Symptoms: Sinus congestion  Reviewed Health History In EMR: Yes  Reviewed Medications In EMR: Yes  Reviewed Allergies In EMR: Yes  Reviewed Surgeries / Procedures: Yes  Date of Onset of Symptoms: 03/08/2013  Guideline(s) Used:  Sinus Pain and Congestion  Disposition Per Guideline:   See Today or Tomorrow in Office  Reason For Disposition Reached:   Sinus congestion (pressure, fullness) present > 10 days  Advice Given:  Call Back If:   You become worse.  Patient Will Follow Care Advice:  YES  Appointment Scheduled:  04/28/2013 16:00:00 Appointment Scheduled Provider:  Maximino Bryant

## 2013-04-28 ENCOUNTER — Ambulatory Visit: Payer: Self-pay | Admitting: Nurse Practitioner

## 2013-04-28 MED ORDER — AMOXICILLIN-POT CLAVULANATE 875-125 MG PO TABS: 1.0000 | ORAL_TABLET | Freq: Two times a day (BID) | ORAL | Status: DC

## 2013-04-28 NOTE — Telephone Encounter (Signed)
Pt wanted to cancel appt at [facility]; cancelled.

## 2013-04-28 NOTE — Telephone Encounter (Signed)
Rx sent to pharmacy   

## 2013-04-28 NOTE — Addendum Note (Signed)
Addended by: Azucena Freed on: 04/28/2013 09:36 AM   Modules accepted: Orders

## 2013-04-28 NOTE — Telephone Encounter (Signed)
Called and spoke with pt and pt is aware.  

## 2013-04-28 NOTE — Telephone Encounter (Signed)
Call in Augmentin [phone] bid for 10 days  

## 2013-07-18 DIAGNOSIS — E785 Hyperlipidemia, unspecified: Secondary | ICD-10-CM

## 2013-07-18 DIAGNOSIS — R079 Chest pain, unspecified: Secondary | ICD-10-CM

## 2013-07-18 DIAGNOSIS — R002 Palpitations: Secondary | ICD-10-CM | POA: Insufficient documentation

## 2013-07-18 DIAGNOSIS — R42 Dizziness and giddiness: Secondary | ICD-10-CM

## 2013-07-18 HISTORY — DX: Chest pain, unspecified: R07.9

## 2013-07-18 HISTORY — DX: Hyperlipidemia, unspecified: E78.5

## 2013-07-18 HISTORY — DX: Palpitations: R00.2

## 2013-07-18 HISTORY — DX: Dizziness and giddiness: R42

## 2013-07-28 ENCOUNTER — Other Ambulatory Visit: Payer: 59

## 2013-11-28 ENCOUNTER — Other Ambulatory Visit: Payer: Self-pay | Admitting: Family Medicine

## 2013-11-29 NOTE — Telephone Encounter (Signed)
Call in #60 with 2 rf 

## 2014-03-30 ENCOUNTER — Ambulatory Visit (INDEPENDENT_AMBULATORY_CARE_PROVIDER_SITE_OTHER): Payer: 59 | Admitting: Family Medicine

## 2014-03-30 ENCOUNTER — Encounter: Payer: Self-pay | Admitting: Family Medicine

## 2014-03-30 VITALS — BP 122/74 | HR 79 | Temp 98.0°F | Resp 18 | Ht 71.5 in | Wt 148.0 lb

## 2014-03-30 DIAGNOSIS — J180 Bronchopneumonia, unspecified organism: Secondary | ICD-10-CM

## 2014-03-30 DIAGNOSIS — R599 Enlarged lymph nodes, unspecified: Secondary | ICD-10-CM

## 2014-03-30 DIAGNOSIS — R590 Localized enlarged lymph nodes: Secondary | ICD-10-CM

## 2014-03-30 MED ORDER — AZITHROMYCIN 250 MG PO TABS: ORAL_TABLET | ORAL | Status: DC

## 2014-03-30 NOTE — Assessment & Plan Note (Signed)
Present for 1 yr. I encouraged him to DEFINITELY follow through with ENT referral that the allergist made for him.  He said he would definitely go.

## 2014-03-30 NOTE — Assessment & Plan Note (Signed)
Azith x 5d. Continue robitussin DM prn.

## 2014-03-30 NOTE — Progress Notes (Signed)
Pre visit review using our clinic review tool, if applicable. No additional management support is needed unless otherwise documented below in the visit note. 

## 2014-03-30 NOTE — Progress Notes (Signed)
OFFICE VISIT  03/30/2014   CC:  Chief Complaint  Patient presents with  . URI    very fatigue   HPI:    Patient is a 33 y.o. Caucasian male who presents for respiratory symptoms. Onset about 2 wks ago, slight ST with runny nose/PND, started to feel malaise/fatigue last 2d, persistent productive cough. Robitussin DM helping better than mucinex DM last couple of days.  No fevers.  Appetite down some.  No HA or sinus pressure.   Has had pneumonia x 2 in the past.  Also has a small, firm lymph node near angle of left mandible that has been present for about a year--unchanged.  He says his allergist has seen it and has referred him to an ENT for further evaluation.  He asks my opinion about this today.  Past Medical History  Diagnosis Date  . Allergy   . Asthma     exercise induced  . Pancreatitis     May 2012  . Anxiety     Past Surgical History  Procedure Laterality Date  . Wisdom tooth extraction     MEDS: Azelastine qd , ativan  No Known Allergies  ROS As per HPI  PE: Blood pressure 122/74, pulse 79, temperature 98 F (36.7 C), temperature source Oral, resp. rate 18, height 5' 11.5" (1.816 m), weight 148 lb (67.132 kg), SpO2 99 %. VS: noted--normal. Gen: alert, NAD, NONTOXIC APPEARING. HEENT: eyes without injection, drainage, or swelling.  Ears: EACs clear, TMs with normal light reflex and landmarks.  Nose: Clear rhinorrhea, with some dried, crusty exudate adherent to mildly injected mucosa.  No purulent d/c.  No paranasal sinus TTP.  No facial swelling.  Throat and mouth without focal lesion.  No pharyngial swelling, erythema, or exudate.   Neck: supple, no LAD.  Just behind the angle of left mandible there is an approx 2 cm, firm, relatively immobile nodule in subQ tissue that is nontender. LUNGS: CTA bilat, nonlabored resps.   CV: RRR, no m/r/g. EXT: no c/c/e SKIN: no rash  LABS:  none  IMPRESSION AND PLAN:  Bronchopneumonia Azith x 5d. Continue robitussin  DM prn.  Left cervical lymphadenopathy Present for 1 yr. I encouraged him to DEFINITELY follow through with ENT referral that the allergist made for him.  He said he would definitely go.   An After Visit Summary was printed and given to the patient.  FOLLOW UP: Return if symptoms worsen or fail to improve.

## 2014-04-04 ENCOUNTER — Other Ambulatory Visit (INDEPENDENT_AMBULATORY_CARE_PROVIDER_SITE_OTHER): Payer: Self-pay | Admitting: Otolaryngology

## 2014-04-04 DIAGNOSIS — R221 Localized swelling, mass and lump, neck: Secondary | ICD-10-CM

## 2014-04-12 ENCOUNTER — Ambulatory Visit
Admission: RE | Admit: 2014-04-12 | Discharge: 2014-04-12 | Disposition: A | Discharge status: Home / Self Care | Payer: 59 | Source: Ambulatory Visit | Attending: Otolaryngology | Admitting: Otolaryngology

## 2014-04-12 DIAGNOSIS — R221 Localized swelling, mass and lump, neck: Secondary | ICD-10-CM

## 2014-04-12 MED ORDER — IOHEXOL 300 MG/ML  SOLN
75.0000 mL | Freq: Once | INTRAMUSCULAR | Status: AC | PRN
  Administered 2014-04-12: 75 mL via INTRAVENOUS

## 2014-08-09 ENCOUNTER — Ambulatory Visit (INDEPENDENT_AMBULATORY_CARE_PROVIDER_SITE_OTHER): Payer: 59 | Admitting: Family Medicine

## 2014-08-09 ENCOUNTER — Encounter: Payer: Self-pay | Admitting: Family Medicine

## 2014-08-09 VITALS — BP 121/78 | HR 85 | Temp 98.8°F | Ht 71.0 in | Wt 152.0 lb

## 2014-08-09 DIAGNOSIS — J069 Acute upper respiratory infection, unspecified: Secondary | ICD-10-CM

## 2014-08-09 MED ORDER — AMOXICILLIN 875 MG PO TABS: 875.0000 mg | ORAL_TABLET | Freq: Two times a day (BID) | ORAL | Status: AC

## 2014-08-09 NOTE — Progress Notes (Signed)
OFFICE NOTE  08/09/2014  CC:  Chief Complaint  Patient presents with  . Nasal Congestion    started sunday   HPI: Patient is a 34 y.o. Caucasian male who is here for "congestion".  Pt is nonsmoker. Pressure in sinuses, nasal congestion, some sneezing, some PND, mild fatigue/malaise.  Decreased appetite.  No fever.  Slight cough.  Mucinex D.  Greenish yellow mucous.  No SOB or wheezing. He reports he did have the left sided neck nodule next to his left angle of mandible checked out and CT confirmed it was a bony structure in his neck: benign, NOT a lymph node.   Pertinent PMH:  PMH and PSH reviewed  MEDS:  Astelin nasal spray, lorazepam prn, vit D 50,000 U  PE: Blood pressure 121/78, pulse 85, temperature 98.8 F (37.1 C), temperature source Temporal, height 5\' 11"  (1.803 m), weight 152 lb (68.947 kg), SpO2 99 %. VS: noted--normal. Gen: alert, NAD, NONTOXIC APPEARING. HEENT: eyes without injection, drainage, or swelling.  Ears: EACs clear, TMs with normal light reflex and landmarks.  Nose: Clear rhinorrhea, with some dried, crusty exudate adherent to mildly injected mucosa.  No purulent d/c.  No paranasal sinus TTP.  No facial swelling.  Throat and mouth without focal lesion.  No pharyngial swelling, erythema, or exudate.   Neck: supple, no LAD.   LUNGS: CTA bilat, nonlabored resps.   CV: RRR, no m/r/g. EXT: no c/c/e SKIN: no rash    IMPRESSION AND PLAN:  URI, viral. Trial of mucinex DM or robitussin DM otc as directed on the box. May use OTC nasal saline spray or irrigation solution bid. OTC nonsedating antihistamines prn discussed.  Decongestant use discussed--ok if tolerated in the past w/out side effect and if pt has no hx of HTN. I did give pt rx for amoxil 875mg  bid x 10d to hold and fill only if not improved in 6d.  If pt worse or has sx's that he should be rechecked for he was encouraged to call or return even if he filled the amoxil rx.  An After Visit Summary was  printed and given to the patient.  FOLLOW UP: prn

## 2014-08-09 NOTE — Progress Notes (Signed)
Pre visit review using our clinic review tool, if applicable. No additional management support is needed unless otherwise documented below in the visit note. 

## 2014-10-17 ENCOUNTER — Telehealth: Payer: Self-pay | Admitting: Family Medicine

## 2014-10-17 NOTE — Telephone Encounter (Signed)
°    [facility] - Client [place] Call Center  Patient Name: Raymond Bryant  DOB: [birth year]    Initial Comment Caller states he was bitten by spider, has swelling and redness   Nurse Assessment  Nurse: Wynetta Emery, RN, Baker Janus Date/Time (Eastern Time): 10/17/2014 1:28:02 PM  Confirm and document reason for call. If symptomatic, describe symptoms. ---Gaspar Bidding was bit by an insect or spider bite with two bite marks left hand by ring finger. swollen and red at this time itchy -- onset Sunday  Has the patient traveled out of the country within the last 30 days? ---No  Does the patient require triage? ---Yes  Related visit to physician within the last 2 weeks? ---No  Does the PT have any chronic conditions? (i.e. diabetes, asthma, etc.) ---No     Guidelines    Guideline Title Affirmed Question Affirmed Notes  Spider Bite - [place] [1] Red or very tender (to touch) area AND [2] started over 24 hours after the bite    Final Disposition User   See Physician within [place], RN, Baker Janus    Comments  10/18/2014 345 pm spider bite Dr, Sarajane Jews

## 2014-10-18 ENCOUNTER — Ambulatory Visit (INDEPENDENT_AMBULATORY_CARE_PROVIDER_SITE_OTHER): Payer: 59 | Admitting: Family Medicine

## 2014-10-18 ENCOUNTER — Encounter: Payer: Self-pay | Admitting: Family Medicine

## 2014-10-18 VITALS — BP 120/80 | HR 104 | Temp 98.6°F | Wt 159.0 lb

## 2014-10-18 DIAGNOSIS — T63304A Toxic effect of unspecified spider venom, undetermined, initial encounter: Secondary | ICD-10-CM | POA: Diagnosis not present

## 2014-10-18 DIAGNOSIS — Z23 Encounter for immunization: Secondary | ICD-10-CM | POA: Diagnosis not present

## 2014-10-18 MED ORDER — DOXYCYCLINE HYCLATE 100 MG PO CAPS: 100.0000 mg | ORAL_CAPSULE | Freq: Two times a day (BID) | ORAL | Status: AC

## 2014-10-18 NOTE — Progress Notes (Signed)
Pre visit review using our clinic review tool, if applicable. No additional management support is needed unless otherwise documented below in the visit note. 

## 2014-10-18 NOTE — Progress Notes (Signed)
   Subjective:    Patient ID: Raymond Bryant, male    DOB: 15-Mar-1981, 34 y.o.   MRN: [id number]  HPI Here for a bite to the left 4th finger which occurred at home 3 days ago. While he was reaching behind something on a shelf he felt a sharp sting on the finger and then saw 2 tiny red marks. He never saw what bit him. Since then the finger has become red and swollen. it is mildly painful. No fever or systemic sx.     Review of Systems  Constitutional: Negative.   Musculoskeletal: Positive for joint swelling.  Skin: Positive for color change and wound.       Objective:   Physical Exam  Constitutional: He appears well-developed and well-nourished.  Skin:  Left 4th finger has an area or erythema and swelling, this is tender           Assessment & Plan:  Cellulitis from a bite, treat with ice packs and Doxycycline.

## 2015-01-23 ENCOUNTER — Telehealth: Payer: Self-pay | Admitting: Family Medicine

## 2015-01-25 NOTE — Telephone Encounter (Signed)
Call in #60 with 5 rf 

## 2015-01-25 NOTE — Telephone Encounter (Signed)
[place] [place] - [address]. [phone]  Requesting refill of LORazepam (ATIVAN) 0.5 MG tablet last filled 04/09/14, #60

## 2015-01-26 MED ORDER — LORAZEPAM 0.5 MG PO TABS: 0.5000 mg | ORAL_TABLET | Freq: Three times a day (TID) | ORAL | Status: DC | PRN

## 2015-01-26 NOTE — Telephone Encounter (Signed)
I called in script 

## 2015-05-14 IMAGING — CT CT NECK W/ CM
4 of 6 series · 14 of 33 positions shown, 16 images · IV contrast (75CC OMNI 300)
Comparison: None.

CLINICAL DATA: Left-sided neck mass.

EXAM:
CT NECK WITH CONTRAST
TECHNIQUE: Multidetector CT imaging of the neck was performed using the
standard protocol following the bolus administration of intravenous
contrast.
CONTRAST:  75mL OMNIPAQUE IOHEXOL 300 MG/ML  SOLN

[Series 3: axial neck · axial · 0.49mm/px · z∈[+6,+94]mm · 2 of 107 slices shown]
[im 36/107  bone]
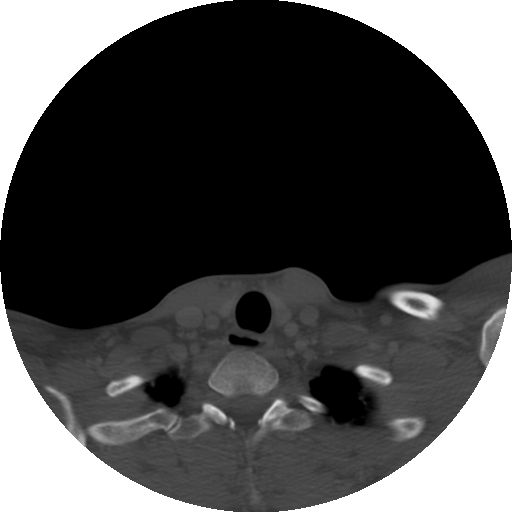
[im 71/107  bone]
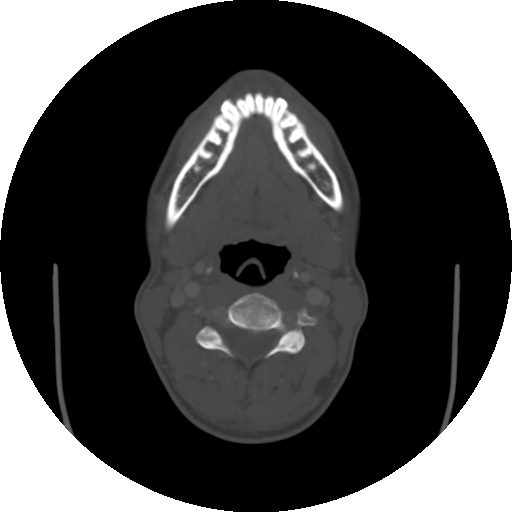

[Series 200: cor · coronal · 0.53mm/px · 3 of 105 slices shown]
[im 29/105  bone]
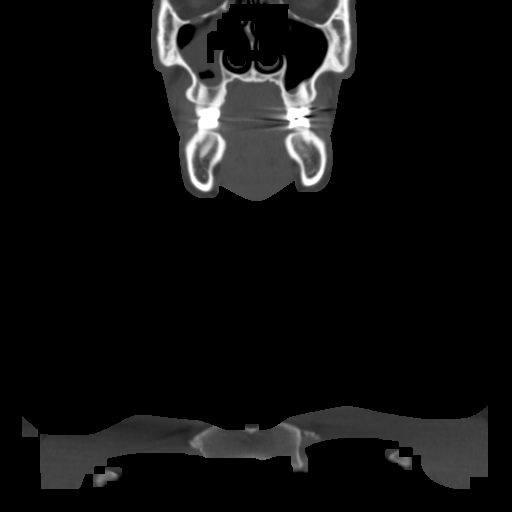
[im 45/105  bone]
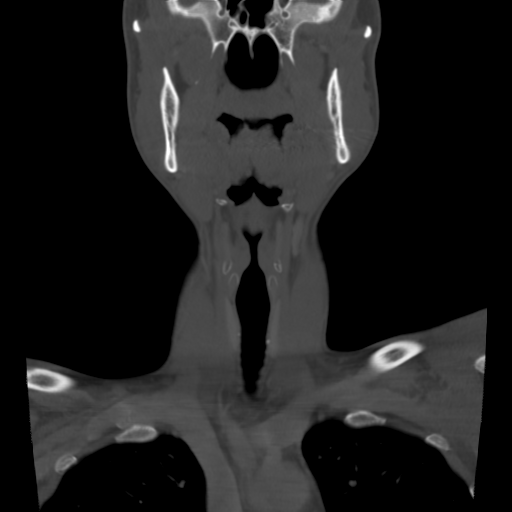
[im 61/105  bone]
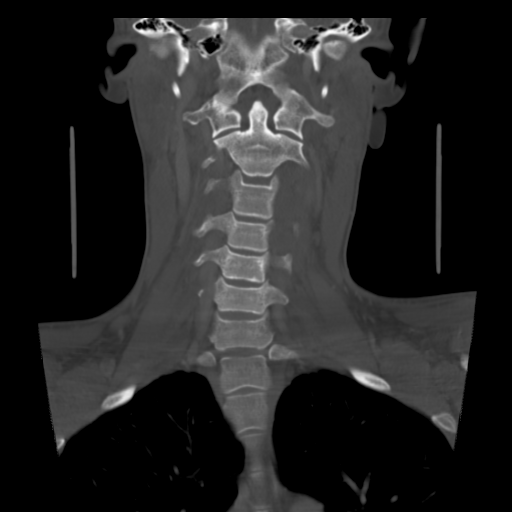

[Series 201: angled axial · axial · 0.53mm/px · z∈[-86,+64]mm · 4 of 150 slices shown, 5 images]
[im 30/150  soft-tissue]
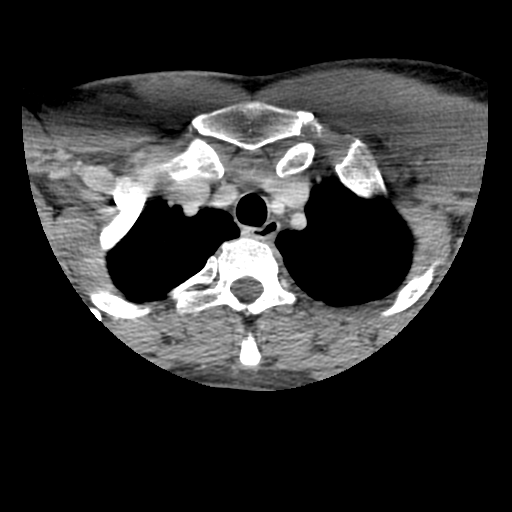
[im 30/150  bone]
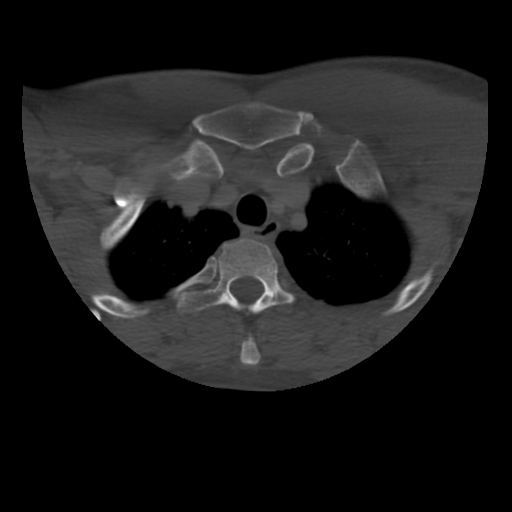
[im 60/150  bone]
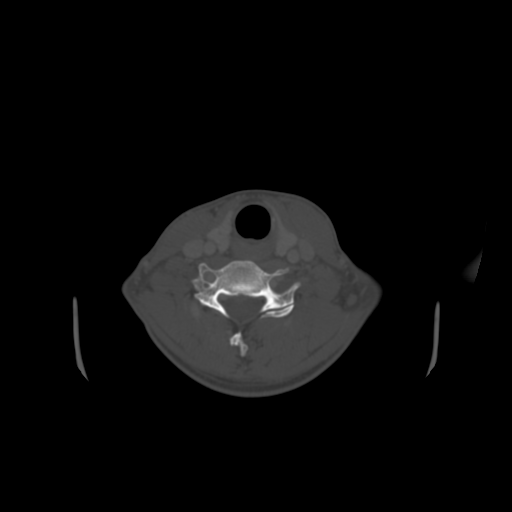
[im 90/150  bone]
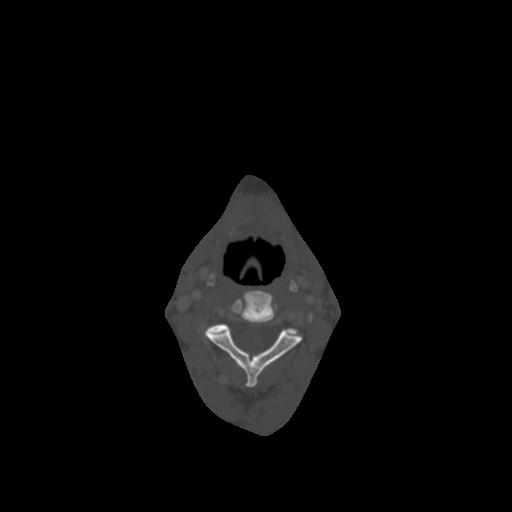
[im 120/150  bone]
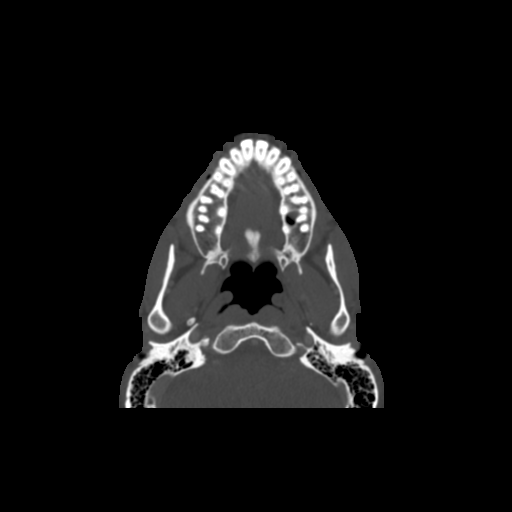

[Series 203: sag · sagittal · 0.53mm/px · 5 of 85 slices shown, 6 images]
[im 29/85  bone]
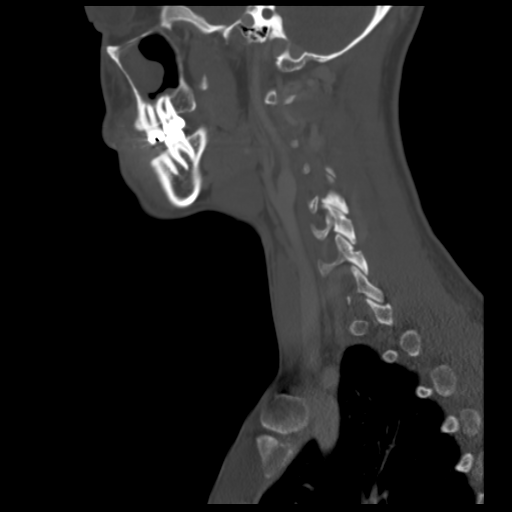
[im 36/85  bone]
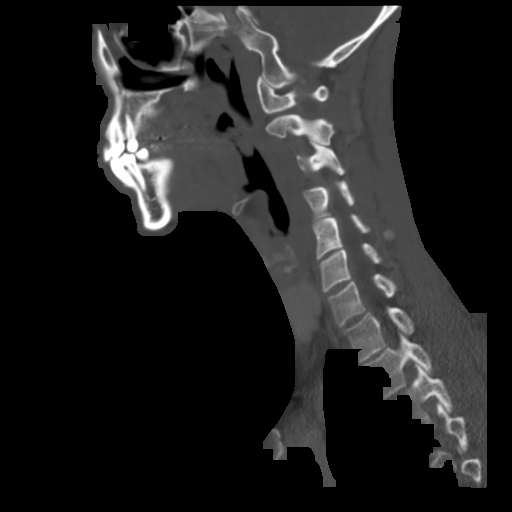
[im 43/85  soft-tissue]
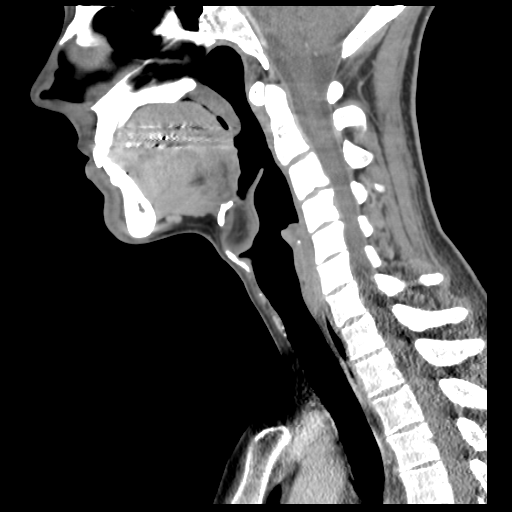
[im 43/85  bone]
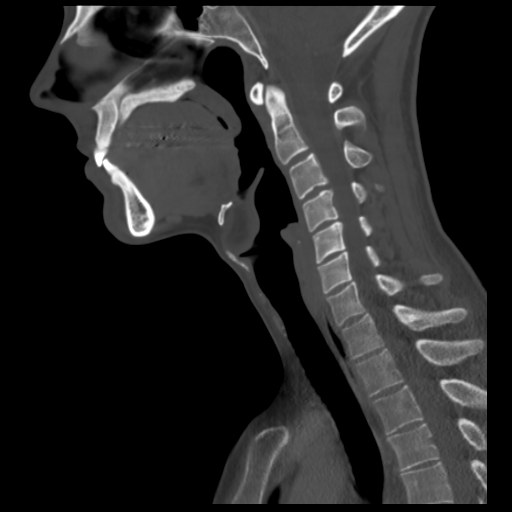
[im 50/85  bone]
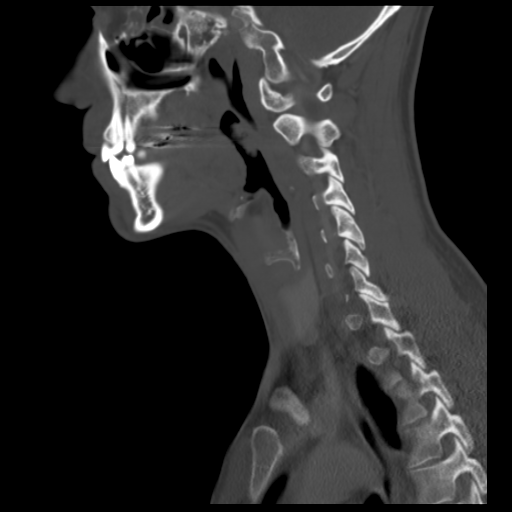
[im 57/85  bone]
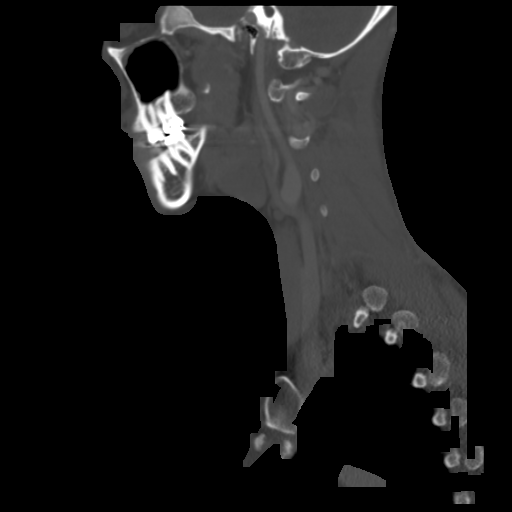

[14 of 33 positions shown; findings below may reference images not displayed]

FINDINGS: The area marked corresponds with posterior aspect of the parotid
gland. There is no discrete mass lesion subjacent to the area marked
are within the left parotid gland. There is no significant
adenopathy. The sternocleidomastoid muscle is within normal limits.

No focal mucosal or submucosal lesion is present. The vocal cords
are midline and symmetric.

Thyroid is unremarkable. The mediastinum is within normal limits.
The lung apices are clear. The bone windows are unremarkable.

Sub cm level 2 lymph nodes are present bilaterally, separate from
the area marked. These are slightly more prominent on the left.
IMPRESSION: 1. No discrete lesion subjacent to the area marked.
2. The area marked overlies the posterior left parotid gland. This
could be related to parotid inflammation which has since resolved.
3. Slight asymmetry of sub cm left level 2 lymph nodes.

## 2015-05-15 DIAGNOSIS — J3081 Allergic rhinitis due to animal (cat) (dog) hair and dander: Secondary | ICD-10-CM | POA: Diagnosis not present

## 2015-05-15 DIAGNOSIS — J301 Allergic rhinitis due to pollen: Secondary | ICD-10-CM | POA: Diagnosis not present

## 2015-05-15 DIAGNOSIS — J3089 Other allergic rhinitis: Secondary | ICD-10-CM | POA: Diagnosis not present

## 2015-06-06 DIAGNOSIS — D229 Melanocytic nevi, unspecified: Secondary | ICD-10-CM

## 2015-06-06 DIAGNOSIS — D239 Other benign neoplasm of skin, unspecified: Secondary | ICD-10-CM | POA: Diagnosis not present

## 2015-06-06 DIAGNOSIS — C4359 Malignant melanoma of other part of trunk: Secondary | ICD-10-CM | POA: Diagnosis not present

## 2015-06-06 DIAGNOSIS — D485 Neoplasm of uncertain behavior of skin: Secondary | ICD-10-CM | POA: Diagnosis not present

## 2015-06-06 DIAGNOSIS — D039 Melanoma in situ, unspecified: Secondary | ICD-10-CM

## 2015-06-06 HISTORY — DX: Melanoma in situ, unspecified: D03.9

## 2015-06-06 HISTORY — DX: Melanocytic nevi, unspecified: D22.9

## 2015-06-08 DIAGNOSIS — J3089 Other allergic rhinitis: Secondary | ICD-10-CM | POA: Diagnosis not present

## 2015-06-08 DIAGNOSIS — J3081 Allergic rhinitis due to animal (cat) (dog) hair and dander: Secondary | ICD-10-CM | POA: Diagnosis not present

## 2015-06-08 DIAGNOSIS — J301 Allergic rhinitis due to pollen: Secondary | ICD-10-CM | POA: Diagnosis not present

## 2015-07-03 ENCOUNTER — Encounter: Payer: Self-pay | Admitting: Family Medicine

## 2015-07-03 ENCOUNTER — Ambulatory Visit (INDEPENDENT_AMBULATORY_CARE_PROVIDER_SITE_OTHER): Payer: 59 | Admitting: Family Medicine

## 2015-07-03 VITALS — BP 136/83 | HR 135 | Temp 102.1°F | Ht 71.0 in | Wt 160.0 lb

## 2015-07-03 DIAGNOSIS — J019 Acute sinusitis, unspecified: Secondary | ICD-10-CM | POA: Diagnosis not present

## 2015-07-03 MED ORDER — HYDROCODONE-HOMATROPINE 5-1.5 MG/5ML PO SYRP: 5.0000 mL | ORAL_SOLUTION | ORAL | Status: DC | PRN

## 2015-07-03 MED ORDER — AZITHROMYCIN 250 MG PO TABS: ORAL_TABLET | ORAL | Status: DC

## 2015-07-03 NOTE — Progress Notes (Signed)
Pre visit review using our clinic review tool, if applicable. No additional management support is needed unless otherwise documented below in the visit note. 

## 2015-07-03 NOTE — Progress Notes (Signed)
   Subjective:    Patient ID: Raymond Bryant, male    DOB: 1981/05/02, 35 y.o.   MRN: [id number]  HPI Here for 2 and 1/2 weeks of stuffy head, body aches, and a dry cough. No NVD. He felt better several days ago but now feels worse again. He now has a fever for the first time. No chest pain or SOB. Drinking fluids and taking Mucinex and Tylenol. His wife and 2 kids are recovering from influenza, diagnosed at the pediatrician's office.    Review of Systems  Constitutional: Positive for fever.  HENT: Positive for congestion, postnasal drip and sinus pressure. Negative for ear pain and sore throat.   Eyes: Negative.   Respiratory: Positive for cough.   Cardiovascular: Negative.        Objective:   Physical Exam  Constitutional: He appears well-developed and well-nourished.  HENT:  Right Ear: External ear normal.  Left Ear: External ear normal.  Nose: Nose normal.  Mouth/Throat: Oropharynx is clear and moist.  Eyes: Conjunctivae are normal.  Neck: No thyromegaly present.  Cardiovascular: Normal rate, regular rhythm, normal heart sounds and intact distal pulses.   Pulmonary/Chest: Effort normal and breath sounds normal.  Lymphadenopathy:    He has no cervical adenopathy.          Assessment & Plan:  Sinusitis secondary to influenza. Treat with a Zpack.

## 2015-07-11 DIAGNOSIS — C4359 Malignant melanoma of other part of trunk: Secondary | ICD-10-CM | POA: Diagnosis not present

## 2015-07-11 DIAGNOSIS — J301 Allergic rhinitis due to pollen: Secondary | ICD-10-CM | POA: Diagnosis not present

## 2015-07-11 DIAGNOSIS — J3089 Other allergic rhinitis: Secondary | ICD-10-CM | POA: Diagnosis not present

## 2015-07-11 DIAGNOSIS — J3081 Allergic rhinitis due to animal (cat) (dog) hair and dander: Secondary | ICD-10-CM | POA: Diagnosis not present

## 2015-07-17 DIAGNOSIS — J3089 Other allergic rhinitis: Secondary | ICD-10-CM | POA: Diagnosis not present

## 2015-07-17 DIAGNOSIS — J301 Allergic rhinitis due to pollen: Secondary | ICD-10-CM | POA: Diagnosis not present

## 2015-07-17 DIAGNOSIS — J3081 Allergic rhinitis due to animal (cat) (dog) hair and dander: Secondary | ICD-10-CM | POA: Diagnosis not present

## 2015-07-19 DIAGNOSIS — J3089 Other allergic rhinitis: Secondary | ICD-10-CM | POA: Diagnosis not present

## 2015-07-19 DIAGNOSIS — J301 Allergic rhinitis due to pollen: Secondary | ICD-10-CM | POA: Diagnosis not present

## 2015-07-19 DIAGNOSIS — J3081 Allergic rhinitis due to animal (cat) (dog) hair and dander: Secondary | ICD-10-CM | POA: Diagnosis not present

## 2015-08-15 DIAGNOSIS — J3081 Allergic rhinitis due to animal (cat) (dog) hair and dander: Secondary | ICD-10-CM | POA: Diagnosis not present

## 2015-08-15 DIAGNOSIS — J3089 Other allergic rhinitis: Secondary | ICD-10-CM | POA: Diagnosis not present

## 2015-08-15 DIAGNOSIS — J301 Allergic rhinitis due to pollen: Secondary | ICD-10-CM | POA: Diagnosis not present

## 2015-08-22 DIAGNOSIS — J3089 Other allergic rhinitis: Secondary | ICD-10-CM | POA: Diagnosis not present

## 2015-08-22 DIAGNOSIS — J3081 Allergic rhinitis due to animal (cat) (dog) hair and dander: Secondary | ICD-10-CM | POA: Diagnosis not present

## 2015-08-22 DIAGNOSIS — J301 Allergic rhinitis due to pollen: Secondary | ICD-10-CM | POA: Diagnosis not present

## 2015-08-23 DIAGNOSIS — D485 Neoplasm of uncertain behavior of skin: Secondary | ICD-10-CM | POA: Diagnosis not present

## 2015-08-28 DIAGNOSIS — J3089 Other allergic rhinitis: Secondary | ICD-10-CM | POA: Diagnosis not present

## 2015-08-28 DIAGNOSIS — J301 Allergic rhinitis due to pollen: Secondary | ICD-10-CM | POA: Diagnosis not present

## 2015-08-28 DIAGNOSIS — J3081 Allergic rhinitis due to animal (cat) (dog) hair and dander: Secondary | ICD-10-CM | POA: Diagnosis not present

## 2015-09-10 DIAGNOSIS — J3089 Other allergic rhinitis: Secondary | ICD-10-CM | POA: Diagnosis not present

## 2015-09-10 DIAGNOSIS — J3081 Allergic rhinitis due to animal (cat) (dog) hair and dander: Secondary | ICD-10-CM | POA: Diagnosis not present

## 2015-09-10 DIAGNOSIS — J301 Allergic rhinitis due to pollen: Secondary | ICD-10-CM | POA: Diagnosis not present

## 2015-09-19 DIAGNOSIS — J301 Allergic rhinitis due to pollen: Secondary | ICD-10-CM | POA: Diagnosis not present

## 2015-09-19 DIAGNOSIS — J3081 Allergic rhinitis due to animal (cat) (dog) hair and dander: Secondary | ICD-10-CM | POA: Diagnosis not present

## 2015-09-19 DIAGNOSIS — J3089 Other allergic rhinitis: Secondary | ICD-10-CM | POA: Diagnosis not present

## 2015-09-24 DIAGNOSIS — J301 Allergic rhinitis due to pollen: Secondary | ICD-10-CM | POA: Diagnosis not present

## 2015-09-24 DIAGNOSIS — J3089 Other allergic rhinitis: Secondary | ICD-10-CM | POA: Diagnosis not present

## 2015-09-24 DIAGNOSIS — J3081 Allergic rhinitis due to animal (cat) (dog) hair and dander: Secondary | ICD-10-CM | POA: Diagnosis not present

## 2015-10-01 DIAGNOSIS — J301 Allergic rhinitis due to pollen: Secondary | ICD-10-CM | POA: Diagnosis not present

## 2015-10-01 DIAGNOSIS — J3081 Allergic rhinitis due to animal (cat) (dog) hair and dander: Secondary | ICD-10-CM | POA: Diagnosis not present

## 2015-10-01 DIAGNOSIS — J3089 Other allergic rhinitis: Secondary | ICD-10-CM | POA: Diagnosis not present

## 2015-10-02 ENCOUNTER — Other Ambulatory Visit: Payer: Self-pay | Admitting: Family Medicine

## 2015-10-02 NOTE — Telephone Encounter (Signed)
Call in #60 with 5 rf 

## 2015-10-03 MED FILL — LORazepam 0.5 MG TABS: 0.5 | 20 days supply | Qty: 60 | Fill #0

## 2015-10-16 DIAGNOSIS — J3081 Allergic rhinitis due to animal (cat) (dog) hair and dander: Secondary | ICD-10-CM | POA: Diagnosis not present

## 2015-10-16 DIAGNOSIS — J301 Allergic rhinitis due to pollen: Secondary | ICD-10-CM | POA: Diagnosis not present

## 2015-10-16 DIAGNOSIS — J3089 Other allergic rhinitis: Secondary | ICD-10-CM | POA: Diagnosis not present

## 2015-10-19 DIAGNOSIS — J301 Allergic rhinitis due to pollen: Secondary | ICD-10-CM | POA: Diagnosis not present

## 2015-10-19 DIAGNOSIS — J3089 Other allergic rhinitis: Secondary | ICD-10-CM | POA: Diagnosis not present

## 2015-10-19 DIAGNOSIS — J3081 Allergic rhinitis due to animal (cat) (dog) hair and dander: Secondary | ICD-10-CM | POA: Diagnosis not present

## 2015-10-29 DIAGNOSIS — J3089 Other allergic rhinitis: Secondary | ICD-10-CM | POA: Diagnosis not present

## 2015-10-29 DIAGNOSIS — J3081 Allergic rhinitis due to animal (cat) (dog) hair and dander: Secondary | ICD-10-CM | POA: Diagnosis not present

## 2015-10-29 DIAGNOSIS — J301 Allergic rhinitis due to pollen: Secondary | ICD-10-CM | POA: Diagnosis not present

## 2015-11-02 DIAGNOSIS — J301 Allergic rhinitis due to pollen: Secondary | ICD-10-CM | POA: Diagnosis not present

## 2015-11-02 DIAGNOSIS — J3089 Other allergic rhinitis: Secondary | ICD-10-CM | POA: Diagnosis not present

## 2015-11-02 DIAGNOSIS — J3081 Allergic rhinitis due to animal (cat) (dog) hair and dander: Secondary | ICD-10-CM | POA: Diagnosis not present

## 2015-11-05 DIAGNOSIS — J301 Allergic rhinitis due to pollen: Secondary | ICD-10-CM | POA: Diagnosis not present

## 2015-11-05 DIAGNOSIS — J3081 Allergic rhinitis due to animal (cat) (dog) hair and dander: Secondary | ICD-10-CM | POA: Diagnosis not present

## 2015-11-05 DIAGNOSIS — J3089 Other allergic rhinitis: Secondary | ICD-10-CM | POA: Diagnosis not present

## 2015-11-08 DIAGNOSIS — J3089 Other allergic rhinitis: Secondary | ICD-10-CM | POA: Diagnosis not present

## 2015-11-08 DIAGNOSIS — J301 Allergic rhinitis due to pollen: Secondary | ICD-10-CM | POA: Diagnosis not present

## 2015-11-08 DIAGNOSIS — J3081 Allergic rhinitis due to animal (cat) (dog) hair and dander: Secondary | ICD-10-CM | POA: Diagnosis not present

## 2015-11-15 DIAGNOSIS — J301 Allergic rhinitis due to pollen: Secondary | ICD-10-CM | POA: Diagnosis not present

## 2015-11-15 DIAGNOSIS — J3081 Allergic rhinitis due to animal (cat) (dog) hair and dander: Secondary | ICD-10-CM | POA: Diagnosis not present

## 2015-11-15 DIAGNOSIS — J3089 Other allergic rhinitis: Secondary | ICD-10-CM | POA: Diagnosis not present

## 2015-11-23 DIAGNOSIS — J301 Allergic rhinitis due to pollen: Secondary | ICD-10-CM | POA: Diagnosis not present

## 2015-11-23 DIAGNOSIS — J3081 Allergic rhinitis due to animal (cat) (dog) hair and dander: Secondary | ICD-10-CM | POA: Diagnosis not present

## 2015-11-23 DIAGNOSIS — J3089 Other allergic rhinitis: Secondary | ICD-10-CM | POA: Diagnosis not present

## 2015-11-27 DIAGNOSIS — J3089 Other allergic rhinitis: Secondary | ICD-10-CM | POA: Diagnosis not present

## 2015-11-27 DIAGNOSIS — J301 Allergic rhinitis due to pollen: Secondary | ICD-10-CM | POA: Diagnosis not present

## 2015-11-27 DIAGNOSIS — J3081 Allergic rhinitis due to animal (cat) (dog) hair and dander: Secondary | ICD-10-CM | POA: Diagnosis not present

## 2015-12-11 DIAGNOSIS — J301 Allergic rhinitis due to pollen: Secondary | ICD-10-CM | POA: Diagnosis not present

## 2015-12-11 DIAGNOSIS — J3089 Other allergic rhinitis: Secondary | ICD-10-CM | POA: Diagnosis not present

## 2015-12-11 DIAGNOSIS — J3081 Allergic rhinitis due to animal (cat) (dog) hair and dander: Secondary | ICD-10-CM | POA: Diagnosis not present

## 2015-12-12 DIAGNOSIS — H524 Presbyopia: Secondary | ICD-10-CM | POA: Diagnosis not present

## 2015-12-12 DIAGNOSIS — H5203 Hypermetropia, bilateral: Secondary | ICD-10-CM | POA: Diagnosis not present

## 2015-12-21 DIAGNOSIS — J3081 Allergic rhinitis due to animal (cat) (dog) hair and dander: Secondary | ICD-10-CM | POA: Diagnosis not present

## 2015-12-21 DIAGNOSIS — J3089 Other allergic rhinitis: Secondary | ICD-10-CM | POA: Diagnosis not present

## 2015-12-21 DIAGNOSIS — J301 Allergic rhinitis due to pollen: Secondary | ICD-10-CM | POA: Diagnosis not present

## 2016-01-01 DIAGNOSIS — J3089 Other allergic rhinitis: Secondary | ICD-10-CM | POA: Diagnosis not present

## 2016-01-01 DIAGNOSIS — J301 Allergic rhinitis due to pollen: Secondary | ICD-10-CM | POA: Diagnosis not present

## 2016-01-01 DIAGNOSIS — J3081 Allergic rhinitis due to animal (cat) (dog) hair and dander: Secondary | ICD-10-CM | POA: Diagnosis not present

## 2016-01-11 DIAGNOSIS — J301 Allergic rhinitis due to pollen: Secondary | ICD-10-CM | POA: Diagnosis not present

## 2016-01-11 DIAGNOSIS — J3089 Other allergic rhinitis: Secondary | ICD-10-CM | POA: Diagnosis not present

## 2016-01-11 DIAGNOSIS — J3081 Allergic rhinitis due to animal (cat) (dog) hair and dander: Secondary | ICD-10-CM | POA: Diagnosis not present

## 2016-01-21 DIAGNOSIS — J3081 Allergic rhinitis due to animal (cat) (dog) hair and dander: Secondary | ICD-10-CM | POA: Diagnosis not present

## 2016-01-21 DIAGNOSIS — J301 Allergic rhinitis due to pollen: Secondary | ICD-10-CM | POA: Diagnosis not present

## 2016-01-21 DIAGNOSIS — J3089 Other allergic rhinitis: Secondary | ICD-10-CM | POA: Diagnosis not present

## 2016-02-15 DIAGNOSIS — J3081 Allergic rhinitis due to animal (cat) (dog) hair and dander: Secondary | ICD-10-CM | POA: Diagnosis not present

## 2016-02-15 DIAGNOSIS — J3089 Other allergic rhinitis: Secondary | ICD-10-CM | POA: Diagnosis not present

## 2016-02-15 DIAGNOSIS — J301 Allergic rhinitis due to pollen: Secondary | ICD-10-CM | POA: Diagnosis not present

## 2016-02-26 DIAGNOSIS — J301 Allergic rhinitis due to pollen: Secondary | ICD-10-CM | POA: Diagnosis not present

## 2016-02-26 DIAGNOSIS — J3089 Other allergic rhinitis: Secondary | ICD-10-CM | POA: Diagnosis not present

## 2016-02-26 DIAGNOSIS — J4520 Mild intermittent asthma, uncomplicated: Secondary | ICD-10-CM | POA: Diagnosis not present

## 2016-02-26 DIAGNOSIS — J3081 Allergic rhinitis due to animal (cat) (dog) hair and dander: Secondary | ICD-10-CM | POA: Diagnosis not present

## 2016-02-26 DIAGNOSIS — Z91010 Allergy to peanuts: Secondary | ICD-10-CM | POA: Diagnosis not present

## 2016-03-06 DIAGNOSIS — J301 Allergic rhinitis due to pollen: Secondary | ICD-10-CM | POA: Diagnosis not present

## 2016-03-06 DIAGNOSIS — J3081 Allergic rhinitis due to animal (cat) (dog) hair and dander: Secondary | ICD-10-CM | POA: Diagnosis not present

## 2016-03-06 DIAGNOSIS — J3089 Other allergic rhinitis: Secondary | ICD-10-CM | POA: Diagnosis not present

## 2016-03-17 DIAGNOSIS — J3089 Other allergic rhinitis: Secondary | ICD-10-CM | POA: Diagnosis not present

## 2016-03-17 DIAGNOSIS — J3081 Allergic rhinitis due to animal (cat) (dog) hair and dander: Secondary | ICD-10-CM | POA: Diagnosis not present

## 2016-03-17 DIAGNOSIS — J301 Allergic rhinitis due to pollen: Secondary | ICD-10-CM | POA: Diagnosis not present

## 2016-03-18 DIAGNOSIS — D239 Other benign neoplasm of skin, unspecified: Secondary | ICD-10-CM | POA: Diagnosis not present

## 2016-03-25 DIAGNOSIS — J301 Allergic rhinitis due to pollen: Secondary | ICD-10-CM | POA: Diagnosis not present

## 2016-03-25 DIAGNOSIS — J3089 Other allergic rhinitis: Secondary | ICD-10-CM | POA: Diagnosis not present

## 2016-03-25 DIAGNOSIS — J3081 Allergic rhinitis due to animal (cat) (dog) hair and dander: Secondary | ICD-10-CM | POA: Diagnosis not present

## 2016-03-31 DIAGNOSIS — J301 Allergic rhinitis due to pollen: Secondary | ICD-10-CM | POA: Diagnosis not present

## 2016-03-31 DIAGNOSIS — J3089 Other allergic rhinitis: Secondary | ICD-10-CM | POA: Diagnosis not present

## 2016-03-31 DIAGNOSIS — J3081 Allergic rhinitis due to animal (cat) (dog) hair and dander: Secondary | ICD-10-CM | POA: Diagnosis not present

## 2016-04-02 DIAGNOSIS — J3089 Other allergic rhinitis: Secondary | ICD-10-CM | POA: Diagnosis not present

## 2016-04-02 DIAGNOSIS — J3081 Allergic rhinitis due to animal (cat) (dog) hair and dander: Secondary | ICD-10-CM | POA: Diagnosis not present

## 2016-04-02 DIAGNOSIS — J301 Allergic rhinitis due to pollen: Secondary | ICD-10-CM | POA: Diagnosis not present

## 2016-04-08 DIAGNOSIS — J3089 Other allergic rhinitis: Secondary | ICD-10-CM | POA: Diagnosis not present

## 2016-04-08 DIAGNOSIS — J301 Allergic rhinitis due to pollen: Secondary | ICD-10-CM | POA: Diagnosis not present

## 2016-04-08 DIAGNOSIS — J3081 Allergic rhinitis due to animal (cat) (dog) hair and dander: Secondary | ICD-10-CM | POA: Diagnosis not present

## 2016-04-18 DIAGNOSIS — J301 Allergic rhinitis due to pollen: Secondary | ICD-10-CM | POA: Diagnosis not present

## 2016-04-18 DIAGNOSIS — J3081 Allergic rhinitis due to animal (cat) (dog) hair and dander: Secondary | ICD-10-CM | POA: Diagnosis not present

## 2016-04-18 DIAGNOSIS — J3089 Other allergic rhinitis: Secondary | ICD-10-CM | POA: Diagnosis not present

## 2016-04-29 DIAGNOSIS — J3089 Other allergic rhinitis: Secondary | ICD-10-CM | POA: Diagnosis not present

## 2016-04-29 DIAGNOSIS — J3081 Allergic rhinitis due to animal (cat) (dog) hair and dander: Secondary | ICD-10-CM | POA: Diagnosis not present

## 2016-04-29 DIAGNOSIS — J301 Allergic rhinitis due to pollen: Secondary | ICD-10-CM | POA: Diagnosis not present

## 2016-05-15 DIAGNOSIS — J3089 Other allergic rhinitis: Secondary | ICD-10-CM | POA: Diagnosis not present

## 2016-05-15 DIAGNOSIS — J3081 Allergic rhinitis due to animal (cat) (dog) hair and dander: Secondary | ICD-10-CM | POA: Diagnosis not present

## 2016-05-15 DIAGNOSIS — J301 Allergic rhinitis due to pollen: Secondary | ICD-10-CM | POA: Diagnosis not present

## 2016-05-16 DIAGNOSIS — J301 Allergic rhinitis due to pollen: Secondary | ICD-10-CM | POA: Diagnosis not present

## 2016-05-19 DIAGNOSIS — J3089 Other allergic rhinitis: Secondary | ICD-10-CM | POA: Diagnosis not present

## 2016-05-19 DIAGNOSIS — J3081 Allergic rhinitis due to animal (cat) (dog) hair and dander: Secondary | ICD-10-CM | POA: Diagnosis not present

## 2016-05-26 DIAGNOSIS — J3089 Other allergic rhinitis: Secondary | ICD-10-CM | POA: Diagnosis not present

## 2016-05-26 DIAGNOSIS — J3081 Allergic rhinitis due to animal (cat) (dog) hair and dander: Secondary | ICD-10-CM | POA: Diagnosis not present

## 2016-05-26 DIAGNOSIS — J301 Allergic rhinitis due to pollen: Secondary | ICD-10-CM | POA: Diagnosis not present

## 2016-05-26 MED FILL — EPINEPHRINE 0.3 MG AUTO-INJ: 0.3 | 30 days supply | Qty: 2 | Fill #0

## 2016-06-02 DIAGNOSIS — J3089 Other allergic rhinitis: Secondary | ICD-10-CM | POA: Diagnosis not present

## 2016-06-02 DIAGNOSIS — J3081 Allergic rhinitis due to animal (cat) (dog) hair and dander: Secondary | ICD-10-CM | POA: Diagnosis not present

## 2016-06-02 DIAGNOSIS — J301 Allergic rhinitis due to pollen: Secondary | ICD-10-CM | POA: Diagnosis not present

## 2016-06-09 DIAGNOSIS — J3089 Other allergic rhinitis: Secondary | ICD-10-CM | POA: Diagnosis not present

## 2016-06-09 DIAGNOSIS — J3081 Allergic rhinitis due to animal (cat) (dog) hair and dander: Secondary | ICD-10-CM | POA: Diagnosis not present

## 2016-06-09 DIAGNOSIS — J301 Allergic rhinitis due to pollen: Secondary | ICD-10-CM | POA: Diagnosis not present

## 2016-06-16 DIAGNOSIS — J3081 Allergic rhinitis due to animal (cat) (dog) hair and dander: Secondary | ICD-10-CM | POA: Diagnosis not present

## 2016-06-16 DIAGNOSIS — J301 Allergic rhinitis due to pollen: Secondary | ICD-10-CM | POA: Diagnosis not present

## 2016-06-16 DIAGNOSIS — J3089 Other allergic rhinitis: Secondary | ICD-10-CM | POA: Diagnosis not present

## 2016-06-20 DIAGNOSIS — J3081 Allergic rhinitis due to animal (cat) (dog) hair and dander: Secondary | ICD-10-CM | POA: Diagnosis not present

## 2016-06-20 DIAGNOSIS — J3089 Other allergic rhinitis: Secondary | ICD-10-CM | POA: Diagnosis not present

## 2016-06-20 DIAGNOSIS — J301 Allergic rhinitis due to pollen: Secondary | ICD-10-CM | POA: Diagnosis not present

## 2016-06-25 DIAGNOSIS — J3089 Other allergic rhinitis: Secondary | ICD-10-CM | POA: Diagnosis not present

## 2016-06-25 DIAGNOSIS — J301 Allergic rhinitis due to pollen: Secondary | ICD-10-CM | POA: Diagnosis not present

## 2016-06-25 DIAGNOSIS — J3081 Allergic rhinitis due to animal (cat) (dog) hair and dander: Secondary | ICD-10-CM | POA: Diagnosis not present

## 2016-07-01 MED FILL — OSELTAMIVIR PHOSPHATE 75 MG: 75 | 5 days supply | Qty: 10 | Fill #0

## 2016-07-01 MED FILL — ONDANSETRON ODT 8 MG TABLET: 8 | 5 days supply | Qty: 15 | Fill #0

## 2016-07-09 DIAGNOSIS — J3081 Allergic rhinitis due to animal (cat) (dog) hair and dander: Secondary | ICD-10-CM | POA: Diagnosis not present

## 2016-07-09 DIAGNOSIS — J301 Allergic rhinitis due to pollen: Secondary | ICD-10-CM | POA: Diagnosis not present

## 2016-07-09 DIAGNOSIS — J3089 Other allergic rhinitis: Secondary | ICD-10-CM | POA: Diagnosis not present

## 2016-07-18 DIAGNOSIS — J301 Allergic rhinitis due to pollen: Secondary | ICD-10-CM | POA: Diagnosis not present

## 2016-07-18 DIAGNOSIS — J3089 Other allergic rhinitis: Secondary | ICD-10-CM | POA: Diagnosis not present

## 2016-07-18 DIAGNOSIS — J3081 Allergic rhinitis due to animal (cat) (dog) hair and dander: Secondary | ICD-10-CM | POA: Diagnosis not present

## 2016-07-28 DIAGNOSIS — J3081 Allergic rhinitis due to animal (cat) (dog) hair and dander: Secondary | ICD-10-CM | POA: Diagnosis not present

## 2016-07-28 DIAGNOSIS — J3089 Other allergic rhinitis: Secondary | ICD-10-CM | POA: Diagnosis not present

## 2016-07-28 DIAGNOSIS — J301 Allergic rhinitis due to pollen: Secondary | ICD-10-CM | POA: Diagnosis not present

## 2016-08-14 DIAGNOSIS — J3089 Other allergic rhinitis: Secondary | ICD-10-CM | POA: Diagnosis not present

## 2016-08-14 DIAGNOSIS — J3081 Allergic rhinitis due to animal (cat) (dog) hair and dander: Secondary | ICD-10-CM | POA: Diagnosis not present

## 2016-08-14 DIAGNOSIS — J301 Allergic rhinitis due to pollen: Secondary | ICD-10-CM | POA: Diagnosis not present

## 2016-08-22 DIAGNOSIS — J3089 Other allergic rhinitis: Secondary | ICD-10-CM | POA: Diagnosis not present

## 2016-08-22 DIAGNOSIS — J301 Allergic rhinitis due to pollen: Secondary | ICD-10-CM | POA: Diagnosis not present

## 2016-08-22 DIAGNOSIS — J3081 Allergic rhinitis due to animal (cat) (dog) hair and dander: Secondary | ICD-10-CM | POA: Diagnosis not present

## 2016-08-29 DIAGNOSIS — J3089 Other allergic rhinitis: Secondary | ICD-10-CM | POA: Diagnosis not present

## 2016-08-29 DIAGNOSIS — J301 Allergic rhinitis due to pollen: Secondary | ICD-10-CM | POA: Diagnosis not present

## 2016-08-29 DIAGNOSIS — J3081 Allergic rhinitis due to animal (cat) (dog) hair and dander: Secondary | ICD-10-CM | POA: Diagnosis not present

## 2016-09-04 DIAGNOSIS — J3081 Allergic rhinitis due to animal (cat) (dog) hair and dander: Secondary | ICD-10-CM | POA: Diagnosis not present

## 2016-09-04 DIAGNOSIS — J301 Allergic rhinitis due to pollen: Secondary | ICD-10-CM | POA: Diagnosis not present

## 2016-09-04 DIAGNOSIS — J3089 Other allergic rhinitis: Secondary | ICD-10-CM | POA: Diagnosis not present

## 2016-09-15 DIAGNOSIS — J3081 Allergic rhinitis due to animal (cat) (dog) hair and dander: Secondary | ICD-10-CM | POA: Diagnosis not present

## 2016-09-15 DIAGNOSIS — L739 Follicular disorder, unspecified: Secondary | ICD-10-CM | POA: Diagnosis not present

## 2016-09-15 DIAGNOSIS — J301 Allergic rhinitis due to pollen: Secondary | ICD-10-CM | POA: Diagnosis not present

## 2016-09-15 DIAGNOSIS — Z85820 Personal history of malignant melanoma of skin: Secondary | ICD-10-CM | POA: Diagnosis not present

## 2016-09-15 DIAGNOSIS — J3089 Other allergic rhinitis: Secondary | ICD-10-CM | POA: Diagnosis not present

## 2016-09-15 DIAGNOSIS — D229 Melanocytic nevi, unspecified: Secondary | ICD-10-CM | POA: Diagnosis not present

## 2016-09-22 ENCOUNTER — Ambulatory Visit (INDEPENDENT_AMBULATORY_CARE_PROVIDER_SITE_OTHER): Payer: 59 | Admitting: Family Medicine

## 2016-09-22 ENCOUNTER — Encounter: Payer: Self-pay | Admitting: Family Medicine

## 2016-09-22 VITALS — BP 120/80 | HR 88 | Temp 98.2°F | Wt 161.0 lb

## 2016-09-22 DIAGNOSIS — S30860A Insect bite (nonvenomous) of lower back and pelvis, initial encounter: Secondary | ICD-10-CM

## 2016-09-22 DIAGNOSIS — W57XXXA Bitten or stung by nonvenomous insect and other nonvenomous arthropods, initial encounter: Secondary | ICD-10-CM | POA: Diagnosis not present

## 2016-09-22 MED ORDER — DOXYCYCLINE HYCLATE 100 MG PO TABS: ORAL_TABLET | ORAL | 0 refills | Status: DC

## 2016-09-22 MED FILL — DOXYCYCLINE HYC 100 MG TAB: 100 | 1 days supply | Qty: 2 | Fill #0

## 2016-09-22 NOTE — Progress Notes (Signed)
Subjective:    Patient ID: Raymond Bryant, male    DOB: 11-15-1980, 36 y.o.   MRN: [id number]  HPI  Raymond Bryant is a 36 year old male who presents today with a tick bite that occurred one day ago. He reports his wife found the tick and noticed it moving. He is unsure if it was attached previously or if the tick just bit him.  Associated pruritis and erythema is noted. He denies fever, chills, sweats, N/V/D, muscle/joint pain, headache, or rash. He is concerned that erythema surrounding bite may be getting larger and wanted to seek care today. Treatment with neosporin and hydrocortisone has provided moderate benefit.   Review of Systems  Constitutional: Negative for chills, fatigue and fever.  HENT: Negative for congestion, rhinorrhea, sinus pain, sinus pressure, sneezing and sore throat.   Respiratory: Negative for cough, shortness of breath and wheezing.   Cardiovascular: Negative for chest pain and palpitations.  Gastrointestinal: Negative for abdominal pain, constipation, diarrhea, nausea and vomiting.  Musculoskeletal: Negative for myalgias.  Skin: Negative for rash.       Tick bite with erythema  Neurological: Negative for dizziness, weakness, light-headedness, numbness and headaches.   Past Medical History:  Diagnosis Date  . Allergy   . Anxiety   . Asthma    exercise induced  . Pancreatitis    May 2012     Social History   Social History  . Marital status: Single    Spouse name: N/A  . Number of children: N/A  . Years of education: N/A   Occupational History  . Not on file.   Social History Main Topics  . Smoking status: Never Smoker  . Smokeless tobacco: Never Used  . Alcohol use 0.0 oz/week     Comment: couples times a month  . Drug use: No  . Sexual activity: Not on file   Other Topics Concern  . Not on file   Social History Narrative  . No narrative on file    Past Surgical History:  Procedure Laterality Date  . WISDOM TOOTH EXTRACTION      No  family history on file.  Allergies  Allergen Reactions  . Other Anaphylaxis    Tree nuts-all of them  . Peanuts [Peanut Oil] Anaphylaxis    Current Outpatient Prescriptions on File Prior to Visit  Medication Sig Dispense Refill  . Azelastine HCl (ASTEPRO NA) Place into the nose daily.    Marland Kitchen HYDROcodone-homatropine (HYDROMET) 5-1.5 MG/5ML syrup Take 5 mLs by mouth every 4 (four) hours as needed. 240 mL 0  . levocetirizine (XYZAL) 5 MG tablet Take 5 mg by mouth at bedtime.  1  . LORazepam (ATIVAN) 0.5 MG tablet TAKE 1 TABLET BY MOUTH EVERY 8 HOURS AS NEEDED 60 tablet 5   No current facility-administered medications on file prior to visit.     BP 120/80 (BP Location: Left Arm, Patient Position: Sitting, Cuff Size: Normal)   Pulse 88   Temp 98.2 F (36.8 C) (Oral)   Wt 161 lb (73 kg)   SpO2 98%   BMI 22.45 kg/m        Objective:   Physical Exam  Constitutional: He is oriented to person, place, and time. He appears well-developed and well-nourished.  Eyes: Pupils are equal, round, and reactive to light. No scleral icterus.  Neck: Neck supple.  Cardiovascular: Normal rate, regular rhythm and intact distal pulses.   Pulmonary/Chest: Effort normal and breath sounds normal. He has no wheezes. He has  no rales.  Lymphadenopathy:    He has no cervical adenopathy.  Neurological: He is alert and oriented to person, place, and time.  Skin: Skin is warm and dry. No rash noted.  Erythemtous area approximately 3 cm x 2 cm on left side of lower back with central punctate. Area does not resemble a target rash. No drainage or edema present  Psychiatric: He has a normal mood and affect. His behavior is normal. Judgment and thought content normal.        Assessment & Plan:  1. Tick bite, initial encounter Exam is reassuring; we discussed prophylaxis reasons of tick being attached > 36 hours and prophylaxis initiation within 72 hours.  While it is unclear if tick was attached, patient opted  for prophylaxis treatment. We discussed risks versus benefits of treatment. Advised patient to take medication with food and follow up if he develops symptoms of fever, rash, or increasing erythema. - doxycycline (VIBRA-TABS) 100 MG tablet; Take both tablets at once with food.  Dispense: 2 tablet; Refill: 0  Follow up with Dr. Sarajane Jews as recommended.  Delano Metz, FNP-C

## 2016-09-22 NOTE — Patient Instructions (Signed)
It was a pleasure to see you today. Please take medication as directed and follow up with Dr. Sarajane Jews for your routine physical.   Tick Bite Information Introduction Ticks are insects that attach themselves to the skin. There are many types of ticks. Common types include wood ticks and deer ticks. Sometimes, ticks carry diseases that can make a person very ill. The most common places for ticks to attach themselves are the scalp, neck, armpits, waist, and groin. HOW CAN YOU PREVENT TICK BITES? Take these steps to help prevent tick bites when you are outdoors:  Wear long sleeves and long pants.  Wear white clothes so you can see ticks more easily.  Tuck your pant legs into your socks.  If walking on a trail, stay in the middle of the trail to avoid brushing against bushes.  Avoid walking through areas with long grass.  Put bug spray on all skin that is showing and along boot tops, pant legs, and sleeve cuffs.  Check clothes, hair, and skin often and before going inside.  Brush off any ticks that are not attached.  Take a shower or bath as soon as possible after being outdoors. HOW SHOULD YOU REMOVE A TICK? Ticks should be removed as soon as possible to help prevent diseases. 1. If latex gloves are available, put them on before trying to remove a tick. 2. Use tweezers to grasp the tick as close to the skin as possible. You may also use curved forceps or a tick removal tool. Grasp the tick as close to its head as possible. Avoid grasping the tick on its body. 3. Pull gently upward until the tick lets go. Do not twist the tick or jerk it suddenly. This may break off the tick's head or mouth parts. 4. Do not squeeze or crush the tick's body. This could force disease-carrying fluids from the tick into your body. 5. After the tick is removed, wash the bite area and your hands with soap and water or alcohol. 6. Apply a small amount of antiseptic cream or ointment to the bite site. 7. Wash any  tools that were used. Do not try to remove a tick by applying a hot match, petroleum jelly, or fingernail polish to the tick. These methods do not work. They may also increase the chances of disease being spread from the tick bite. WHEN SHOULD YOU SEEK HELP? Contact your health care provider if you are unable to remove a tick or if a part of the tick breaks off in the skin. After a tick bite, you need to watch for signs and symptoms of diseases that can be spread by ticks. Contact your health care provider if you develop any of the following:  Fever.  Rash.  Redness and puffiness (swelling) in the area of the tick bite.  Tender, puffy lymph glands.  Watery poop (diarrhea).  Weight loss.  Cough.  Feeling more tired than normal (fatigue).  Muscle, joint, or bone pain.  Belly (abdominal) pain.  Headache.  Change in your level of consciousness.  Trouble walking or moving your legs.  Loss of feeling (numbness) in the legs.  Loss of movement (paralysis).  Shortness of breath.  Confusion.  Throwing up (vomiting) many times. This information is not intended to replace advice given to you by your health care provider. Make sure you discuss any questions you have with your health care provider. Document Released: 07/23/2009 Document Revised: 10/04/2015 Document Reviewed: 10/06/2012 Elsevier Interactive Patient Education  2017 Elsevier  Inc.  

## 2016-09-26 DIAGNOSIS — J301 Allergic rhinitis due to pollen: Secondary | ICD-10-CM | POA: Diagnosis not present

## 2016-09-26 DIAGNOSIS — J3089 Other allergic rhinitis: Secondary | ICD-10-CM | POA: Diagnosis not present

## 2016-09-26 DIAGNOSIS — J3081 Allergic rhinitis due to animal (cat) (dog) hair and dander: Secondary | ICD-10-CM | POA: Diagnosis not present

## 2016-09-29 DIAGNOSIS — J3089 Other allergic rhinitis: Secondary | ICD-10-CM | POA: Diagnosis not present

## 2016-09-29 DIAGNOSIS — J301 Allergic rhinitis due to pollen: Secondary | ICD-10-CM | POA: Diagnosis not present

## 2016-09-29 DIAGNOSIS — J3081 Allergic rhinitis due to animal (cat) (dog) hair and dander: Secondary | ICD-10-CM | POA: Diagnosis not present

## 2016-10-08 DIAGNOSIS — J3089 Other allergic rhinitis: Secondary | ICD-10-CM | POA: Diagnosis not present

## 2016-10-08 DIAGNOSIS — J3081 Allergic rhinitis due to animal (cat) (dog) hair and dander: Secondary | ICD-10-CM | POA: Diagnosis not present

## 2016-10-08 DIAGNOSIS — J301 Allergic rhinitis due to pollen: Secondary | ICD-10-CM | POA: Diagnosis not present

## 2016-10-23 DIAGNOSIS — J301 Allergic rhinitis due to pollen: Secondary | ICD-10-CM | POA: Diagnosis not present

## 2016-10-23 DIAGNOSIS — J3089 Other allergic rhinitis: Secondary | ICD-10-CM | POA: Diagnosis not present

## 2016-11-14 DIAGNOSIS — J3089 Other allergic rhinitis: Secondary | ICD-10-CM | POA: Diagnosis not present

## 2016-11-14 DIAGNOSIS — J301 Allergic rhinitis due to pollen: Secondary | ICD-10-CM | POA: Diagnosis not present

## 2016-11-14 DIAGNOSIS — J3081 Allergic rhinitis due to animal (cat) (dog) hair and dander: Secondary | ICD-10-CM | POA: Diagnosis not present

## 2016-11-18 ENCOUNTER — Ambulatory Visit (INDEPENDENT_AMBULATORY_CARE_PROVIDER_SITE_OTHER): Payer: 59 | Admitting: Family Medicine

## 2016-11-18 ENCOUNTER — Encounter: Payer: Self-pay | Admitting: Family Medicine

## 2016-11-18 VITALS — BP 109/77 | HR 73 | Temp 98.3°F | Ht 71.0 in | Wt 162.0 lb

## 2016-11-18 DIAGNOSIS — Z0000 Encounter for general adult medical examination without abnormal findings: Secondary | ICD-10-CM | POA: Diagnosis not present

## 2016-11-18 LAB — LIPID PANEL
CHOLESTEROL: 189 mg/dL (ref 0–200)
HDL: 47.6 mg/dL (ref >39.00)
LDL Cholesterol: 130 mg/dL — ABNORMAL HIGH (ref 0–99)
NonHDL: 141.77
TRIGLYCERIDES: 59 mg/dL (ref 0.0–149.0)
Total CHOL/HDL Ratio: 4
VLDL: 11.8 mg/dL (ref 0.0–40.0)

## 2016-11-18 LAB — HEPATIC FUNCTION PANEL
ALT: 18 U/L (ref 0–53)
AST: 17 U/L (ref 0–37)
Albumin: 4.7 g/dL (ref 3.5–5.2)
Alkaline Phosphatase: 36 U/L — ABNORMAL LOW (ref 39–117)
Bilirubin, Direct: 0.1 mg/dL (ref 0.0–0.3)
Total Bilirubin: 0.7 mg/dL (ref 0.2–1.2)
Total Protein: 7.4 g/dL (ref 6.0–8.3)

## 2016-11-18 LAB — BASIC METABOLIC PANEL
BUN: 12 mg/dL (ref 6–23)
CO2: 31 meq/L (ref 19–32)
CREATININE: 0.88 mg/dL (ref 0.40–1.50)
Calcium: 9.7 mg/dL (ref 8.4–10.5)
Chloride: 100 mEq/L (ref 96–112)
GFR: 103.24 mL/min (ref >60.00)
GLUCOSE: 101 mg/dL — AB (ref 70–99)
Potassium: 4.1 mEq/L (ref 3.5–5.1)
Sodium: 139 mEq/L (ref 135–145)

## 2016-11-18 LAB — CBC WITH DIFFERENTIAL/PLATELET
Basophils Absolute: 0.1 10*3/uL (ref 0.0–0.1)
Basophils Relative: 1.4 % (ref 0.0–3.0)
Eosinophils Absolute: 0.4 10*3/uL (ref 0.0–0.7)
Eosinophils Relative: 6.7 % — ABNORMAL HIGH (ref 0.0–5.0)
HCT: 45.7 % (ref 39.0–52.0)
Hemoglobin: 15.6 g/dL (ref 13.0–17.0)
LYMPHS ABS: 2.2 10*3/uL (ref 0.7–4.0)
Lymphocytes Relative: 36 % (ref 12.0–46.0)
MCHC: 34.1 g/dL (ref 30.0–36.0)
MCV: 85.5 fl (ref 78.0–100.0)
MONO ABS: 0.5 10*3/uL (ref 0.1–1.0)
Monocytes Relative: 8.6 % (ref 3.0–12.0)
NEUTROS ABS: 2.9 10*3/uL (ref 1.4–7.7)
NEUTROS PCT: 47.3 % (ref 43.0–77.0)
PLATELETS: 257 10*3/uL (ref 150.0–400.0)
RBC: 5.34 Mil/uL (ref 4.22–5.81)
RDW: 13.1 % (ref 11.5–15.5)
WBC: 6.1 10*3/uL (ref 4.0–10.5)

## 2016-11-18 LAB — POC URINALSYSI DIPSTICK (AUTOMATED)
BILIRUBIN UA: NEGATIVE
Blood, UA: NEGATIVE
Glucose, UA: NEGATIVE
KETONES UA: NEGATIVE
Leukocytes, UA: NEGATIVE
Nitrite, UA: NEGATIVE
PH UA: 6.5 (ref 5.0–8.0)
PROTEIN UA: NEGATIVE
SPEC GRAV UA: 1.015 (ref 1.010–1.025)
Urobilinogen, UA: 0.2 E.U./dL

## 2016-11-18 LAB — TSH: TSH: 1.96 u[IU]/mL (ref 0.35–4.50)

## 2016-11-18 NOTE — Patient Instructions (Signed)
WE NOW OFFER   [facility] FAST TRACK!!!  SAME DAY Appointments for ACUTE CARE  Such as: Sprains, Injuries, cuts, abrasions, rashes, muscle pain, joint pain, back pain Colds, flu, sore throats, headache, allergies, cough, fever  Ear pain, sinus and eye infections Abdominal pain, nausea, vomiting, diarrhea, upset stomach Animal/insect bites  3 Easy Ways to Schedule: Walk-In Scheduling Call in scheduling Mychart Sign-up: [email or web address]         

## 2016-11-18 NOTE — Progress Notes (Signed)
   Subjective:    Patient ID: Raymond Bryant, male    DOB: 05/23/1980, 36 y.o.   MRN: [id number]  HPI Here for a well exam. He feels fine. He has a hx of high cholesterol so he tries to watch his diet closely. He avoiuds fried foods, etc. His allergies have been well controlled.    Review of Systems  Constitutional: Negative.   HENT: Negative.   Eyes: Negative.   Respiratory: Negative.   Cardiovascular: Negative.   Gastrointestinal: Negative.   Genitourinary: Negative.   Musculoskeletal: Negative.   Skin: Negative.   Neurological: Negative.   Psychiatric/Behavioral: Negative.        Objective:   Physical Exam  Constitutional: He is oriented to person, place, and time. He appears well-developed and well-nourished. No distress.  HENT:  Head: Normocephalic and atraumatic.  Right Ear: External ear normal.  Left Ear: External ear normal.  Nose: Nose normal.  Mouth/Throat: Oropharynx is clear and moist. No oropharyngeal exudate.  Eyes: Conjunctivae and EOM are normal. Pupils are equal, round, and reactive to light. Right eye exhibits no discharge. Left eye exhibits no discharge. No scleral icterus.  Neck: Neck supple. No JVD present. No tracheal deviation present. No thyromegaly present.  Cardiovascular: Normal rate, regular rhythm, normal heart sounds and intact distal pulses.  Exam reveals no gallop and no friction rub.   No murmur heard. Pulmonary/Chest: Effort normal and breath sounds normal. No respiratory distress. He has no wheezes. He has no rales. He exhibits no tenderness.  Abdominal: Soft. Bowel sounds are normal. He exhibits no distension and no mass. There is no tenderness. There is no rebound and no guarding.  Genitourinary: Rectum normal, prostate normal and penis normal. Rectal exam shows guaiac negative stool. No penile tenderness.  Musculoskeletal: Normal range of motion. He exhibits no edema or tenderness.  Lymphadenopathy:    He has no cervical adenopathy.    Neurological: He is alert and oriented to person, place, and time. He has normal reflexes. No cranial nerve deficit. He exhibits normal muscle tone. Coordination normal.  Skin: Skin is warm and dry. No rash noted. He is not diaphoretic. No erythema. No pallor.  Psychiatric: He has a normal mood and affect. His behavior is normal. Judgment and thought content normal.          Assessment & Plan:  Well exam. We discussed diet and exercise. Get fasting labs.  Alysia Penna, MD

## 2016-11-24 DIAGNOSIS — J301 Allergic rhinitis due to pollen: Secondary | ICD-10-CM | POA: Diagnosis not present

## 2016-11-24 DIAGNOSIS — J3081 Allergic rhinitis due to animal (cat) (dog) hair and dander: Secondary | ICD-10-CM | POA: Diagnosis not present

## 2016-11-24 DIAGNOSIS — J3089 Other allergic rhinitis: Secondary | ICD-10-CM | POA: Diagnosis not present

## 2016-11-26 DIAGNOSIS — J301 Allergic rhinitis due to pollen: Secondary | ICD-10-CM | POA: Diagnosis not present

## 2016-11-27 DIAGNOSIS — J3089 Other allergic rhinitis: Secondary | ICD-10-CM | POA: Diagnosis not present

## 2016-11-27 DIAGNOSIS — J3081 Allergic rhinitis due to animal (cat) (dog) hair and dander: Secondary | ICD-10-CM | POA: Diagnosis not present

## 2016-12-01 DIAGNOSIS — J3089 Other allergic rhinitis: Secondary | ICD-10-CM | POA: Diagnosis not present

## 2016-12-01 DIAGNOSIS — J3081 Allergic rhinitis due to animal (cat) (dog) hair and dander: Secondary | ICD-10-CM | POA: Diagnosis not present

## 2016-12-01 DIAGNOSIS — J301 Allergic rhinitis due to pollen: Secondary | ICD-10-CM | POA: Diagnosis not present

## 2016-12-05 DIAGNOSIS — J3089 Other allergic rhinitis: Secondary | ICD-10-CM | POA: Diagnosis not present

## 2016-12-05 DIAGNOSIS — J301 Allergic rhinitis due to pollen: Secondary | ICD-10-CM | POA: Diagnosis not present

## 2016-12-05 DIAGNOSIS — J3081 Allergic rhinitis due to animal (cat) (dog) hair and dander: Secondary | ICD-10-CM | POA: Diagnosis not present

## 2016-12-09 DIAGNOSIS — J301 Allergic rhinitis due to pollen: Secondary | ICD-10-CM | POA: Diagnosis not present

## 2016-12-09 DIAGNOSIS — J3089 Other allergic rhinitis: Secondary | ICD-10-CM | POA: Diagnosis not present

## 2016-12-09 DIAGNOSIS — J3081 Allergic rhinitis due to animal (cat) (dog) hair and dander: Secondary | ICD-10-CM | POA: Diagnosis not present

## 2016-12-22 DIAGNOSIS — J301 Allergic rhinitis due to pollen: Secondary | ICD-10-CM | POA: Diagnosis not present

## 2016-12-22 DIAGNOSIS — J3081 Allergic rhinitis due to animal (cat) (dog) hair and dander: Secondary | ICD-10-CM | POA: Diagnosis not present

## 2016-12-22 DIAGNOSIS — J3089 Other allergic rhinitis: Secondary | ICD-10-CM | POA: Diagnosis not present

## 2017-01-02 DIAGNOSIS — J3081 Allergic rhinitis due to animal (cat) (dog) hair and dander: Secondary | ICD-10-CM | POA: Diagnosis not present

## 2017-01-02 DIAGNOSIS — J301 Allergic rhinitis due to pollen: Secondary | ICD-10-CM | POA: Diagnosis not present

## 2017-01-02 DIAGNOSIS — J3089 Other allergic rhinitis: Secondary | ICD-10-CM | POA: Diagnosis not present

## 2017-01-07 DIAGNOSIS — J301 Allergic rhinitis due to pollen: Secondary | ICD-10-CM | POA: Diagnosis not present

## 2017-01-07 DIAGNOSIS — J3089 Other allergic rhinitis: Secondary | ICD-10-CM | POA: Diagnosis not present

## 2017-01-07 DIAGNOSIS — J3081 Allergic rhinitis due to animal (cat) (dog) hair and dander: Secondary | ICD-10-CM | POA: Diagnosis not present

## 2017-01-13 DIAGNOSIS — J3081 Allergic rhinitis due to animal (cat) (dog) hair and dander: Secondary | ICD-10-CM | POA: Diagnosis not present

## 2017-01-13 DIAGNOSIS — J3089 Other allergic rhinitis: Secondary | ICD-10-CM | POA: Diagnosis not present

## 2017-01-13 DIAGNOSIS — J301 Allergic rhinitis due to pollen: Secondary | ICD-10-CM | POA: Diagnosis not present

## 2017-01-16 DIAGNOSIS — J3081 Allergic rhinitis due to animal (cat) (dog) hair and dander: Secondary | ICD-10-CM | POA: Diagnosis not present

## 2017-01-16 DIAGNOSIS — J3089 Other allergic rhinitis: Secondary | ICD-10-CM | POA: Diagnosis not present

## 2017-01-16 DIAGNOSIS — J301 Allergic rhinitis due to pollen: Secondary | ICD-10-CM | POA: Diagnosis not present

## 2017-01-22 DIAGNOSIS — J3081 Allergic rhinitis due to animal (cat) (dog) hair and dander: Secondary | ICD-10-CM | POA: Diagnosis not present

## 2017-01-22 DIAGNOSIS — J3089 Other allergic rhinitis: Secondary | ICD-10-CM | POA: Diagnosis not present

## 2017-01-22 DIAGNOSIS — J301 Allergic rhinitis due to pollen: Secondary | ICD-10-CM | POA: Diagnosis not present

## 2017-01-27 DIAGNOSIS — J301 Allergic rhinitis due to pollen: Secondary | ICD-10-CM | POA: Diagnosis not present

## 2017-01-27 DIAGNOSIS — J3081 Allergic rhinitis due to animal (cat) (dog) hair and dander: Secondary | ICD-10-CM | POA: Diagnosis not present

## 2017-01-27 DIAGNOSIS — J3089 Other allergic rhinitis: Secondary | ICD-10-CM | POA: Diagnosis not present

## 2017-02-06 DIAGNOSIS — J3089 Other allergic rhinitis: Secondary | ICD-10-CM | POA: Diagnosis not present

## 2017-02-06 DIAGNOSIS — J3081 Allergic rhinitis due to animal (cat) (dog) hair and dander: Secondary | ICD-10-CM | POA: Diagnosis not present

## 2017-02-06 DIAGNOSIS — J301 Allergic rhinitis due to pollen: Secondary | ICD-10-CM | POA: Diagnosis not present

## 2017-02-17 DIAGNOSIS — J301 Allergic rhinitis due to pollen: Secondary | ICD-10-CM | POA: Diagnosis not present

## 2017-02-17 DIAGNOSIS — J3081 Allergic rhinitis due to animal (cat) (dog) hair and dander: Secondary | ICD-10-CM | POA: Diagnosis not present

## 2017-02-17 DIAGNOSIS — J3089 Other allergic rhinitis: Secondary | ICD-10-CM | POA: Diagnosis not present

## 2017-02-24 DIAGNOSIS — J3089 Other allergic rhinitis: Secondary | ICD-10-CM | POA: Diagnosis not present

## 2017-02-24 DIAGNOSIS — Z91010 Allergy to peanuts: Secondary | ICD-10-CM | POA: Diagnosis not present

## 2017-02-24 DIAGNOSIS — J3081 Allergic rhinitis due to animal (cat) (dog) hair and dander: Secondary | ICD-10-CM | POA: Diagnosis not present

## 2017-02-24 DIAGNOSIS — J4520 Mild intermittent asthma, uncomplicated: Secondary | ICD-10-CM | POA: Diagnosis not present

## 2017-02-24 DIAGNOSIS — J301 Allergic rhinitis due to pollen: Secondary | ICD-10-CM | POA: Diagnosis not present

## 2017-02-24 MED FILL — LEVOCETIRIZINE 5 MG TABLET: 5 | 90 days supply | Qty: 90 | Fill #0

## 2017-02-24 MED FILL — EPINEPHRINE 0.3 MG AUTO-INJ: 0.3 | 30 days supply | Qty: 2 | Fill #0

## 2017-03-05 DIAGNOSIS — J3081 Allergic rhinitis due to animal (cat) (dog) hair and dander: Secondary | ICD-10-CM | POA: Diagnosis not present

## 2017-03-05 DIAGNOSIS — J3089 Other allergic rhinitis: Secondary | ICD-10-CM | POA: Diagnosis not present

## 2017-03-05 DIAGNOSIS — J301 Allergic rhinitis due to pollen: Secondary | ICD-10-CM | POA: Diagnosis not present

## 2017-03-10 ENCOUNTER — Other Ambulatory Visit: Payer: Self-pay | Admitting: Family Medicine

## 2017-03-10 NOTE — Telephone Encounter (Signed)
Call in #60 with 5 rf 

## 2017-03-13 DIAGNOSIS — J3089 Other allergic rhinitis: Secondary | ICD-10-CM | POA: Diagnosis not present

## 2017-03-13 DIAGNOSIS — J301 Allergic rhinitis due to pollen: Secondary | ICD-10-CM | POA: Diagnosis not present

## 2017-03-13 DIAGNOSIS — J3081 Allergic rhinitis due to animal (cat) (dog) hair and dander: Secondary | ICD-10-CM | POA: Diagnosis not present

## 2017-03-18 MED FILL — LORazepam 0.5 MG TABS: 0.5 | 20 days supply | Qty: 60 | Fill #0

## 2017-03-18 NOTE — Telephone Encounter (Signed)
Called in Rx @ 10:51 AM left a VM for the pharmacist.

## 2017-03-26 DIAGNOSIS — J301 Allergic rhinitis due to pollen: Secondary | ICD-10-CM | POA: Diagnosis not present

## 2017-03-26 DIAGNOSIS — J3089 Other allergic rhinitis: Secondary | ICD-10-CM | POA: Diagnosis not present

## 2017-03-26 DIAGNOSIS — J3081 Allergic rhinitis due to animal (cat) (dog) hair and dander: Secondary | ICD-10-CM | POA: Diagnosis not present

## 2017-04-09 DIAGNOSIS — J3081 Allergic rhinitis due to animal (cat) (dog) hair and dander: Secondary | ICD-10-CM | POA: Diagnosis not present

## 2017-04-09 DIAGNOSIS — J3089 Other allergic rhinitis: Secondary | ICD-10-CM | POA: Diagnosis not present

## 2017-04-09 DIAGNOSIS — J301 Allergic rhinitis due to pollen: Secondary | ICD-10-CM | POA: Diagnosis not present

## 2017-04-10 ENCOUNTER — Encounter: Payer: Self-pay | Admitting: Family Medicine

## 2017-04-10 ENCOUNTER — Ambulatory Visit (INDEPENDENT_AMBULATORY_CARE_PROVIDER_SITE_OTHER): Payer: 59 | Admitting: Family Medicine

## 2017-04-10 VITALS — BP 112/68 | HR 73 | Temp 98.0°F | Wt 164.8 lb

## 2017-04-10 DIAGNOSIS — M79672 Pain in left foot: Secondary | ICD-10-CM | POA: Diagnosis not present

## 2017-04-10 NOTE — Progress Notes (Signed)
   Subjective:    Patient ID: Raymond Bryant, male    DOB: 01-09-1981, 36 y.o.   MRN: [id number]  HPI Here for 3 weeks of intermittent mild pain in the lateral left foot. This started after he jammed his foot against the threshold of a door. There was no swelling or bruising.    Review of Systems  Constitutional: Negative.   Respiratory: Negative.   Cardiovascular: Negative.   Musculoskeletal: Positive for arthralgias.       Objective:   Physical Exam  Constitutional: He appears well-developed and well-nourished.  Cardiovascular: Normal rate, regular rhythm, normal heart sounds and intact distal pulses.  Pulmonary/Chest: Effort normal and breath sounds normal. No respiratory distress. He has no wheezes. He has no rales.  Musculoskeletal:  Mildly tender along the mid portion of the left 5th metatarsal. No crepitus           Assessment & Plan:  Probable mild stress fracture of the metatarsal. This should heal on its own in the next few weeks. Wear supportive shoes.  Alysia Penna, MD

## 2017-04-28 DIAGNOSIS — J3089 Other allergic rhinitis: Secondary | ICD-10-CM | POA: Diagnosis not present

## 2017-04-28 DIAGNOSIS — J3081 Allergic rhinitis due to animal (cat) (dog) hair and dander: Secondary | ICD-10-CM | POA: Diagnosis not present

## 2017-04-28 DIAGNOSIS — J301 Allergic rhinitis due to pollen: Secondary | ICD-10-CM | POA: Diagnosis not present

## 2017-05-13 DIAGNOSIS — J3081 Allergic rhinitis due to animal (cat) (dog) hair and dander: Secondary | ICD-10-CM | POA: Diagnosis not present

## 2017-05-13 DIAGNOSIS — J301 Allergic rhinitis due to pollen: Secondary | ICD-10-CM | POA: Diagnosis not present

## 2017-05-13 DIAGNOSIS — J3089 Other allergic rhinitis: Secondary | ICD-10-CM | POA: Diagnosis not present

## 2017-05-26 ENCOUNTER — Telehealth: Payer: Self-pay | Admitting: Family Medicine

## 2017-05-26 DIAGNOSIS — M79672 Pain in left foot: Secondary | ICD-10-CM

## 2017-05-26 NOTE — Telephone Encounter (Signed)
Copied from [place]. Topic: Inquiry >> May 26, 2017 10:24 AM Corie Chiquito, NT wrote: Reason for CRM: Patient was told by Dr.Fry to contact the office if he is still having problems with his left foot. Patient stated that it is still giving him problems and would like to know if Dr.Fry wants him to come into the office to be seen again or does he just want to give him a referral to be seen somewhere else. If someone could give him a call back about this at [phone]

## 2017-05-26 NOTE — Telephone Encounter (Signed)
Sent to PCP for suggestions  

## 2017-05-27 NOTE — Telephone Encounter (Signed)
Called pt and left a detailed VM that referral was placed.

## 2017-05-27 NOTE — Telephone Encounter (Signed)
I referred him to Podiatry

## 2017-05-29 DIAGNOSIS — J3081 Allergic rhinitis due to animal (cat) (dog) hair and dander: Secondary | ICD-10-CM | POA: Diagnosis not present

## 2017-05-29 DIAGNOSIS — J301 Allergic rhinitis due to pollen: Secondary | ICD-10-CM | POA: Diagnosis not present

## 2017-05-29 DIAGNOSIS — J3089 Other allergic rhinitis: Secondary | ICD-10-CM | POA: Diagnosis not present

## 2017-06-05 ENCOUNTER — Ambulatory Visit: Payer: 59 | Admitting: Podiatry

## 2017-06-05 ENCOUNTER — Encounter: Payer: Self-pay | Admitting: Podiatry

## 2017-06-05 ENCOUNTER — Ambulatory Visit (INDEPENDENT_AMBULATORY_CARE_PROVIDER_SITE_OTHER): Payer: 59

## 2017-06-05 VITALS — BP 113/70 | HR 73 | Resp 16

## 2017-06-05 DIAGNOSIS — S9032XA Contusion of left foot, initial encounter: Secondary | ICD-10-CM | POA: Diagnosis not present

## 2017-06-05 DIAGNOSIS — M659 Synovitis and tenosynovitis, unspecified: Secondary | ICD-10-CM

## 2017-06-05 DIAGNOSIS — M779 Enthesopathy, unspecified: Secondary | ICD-10-CM | POA: Diagnosis not present

## 2017-06-05 NOTE — Progress Notes (Signed)
  Subjective:  Patient ID: Raymond Bryant, male    DOB: 1981/01/29,  MRN: [id number]  Chief Complaint  Patient presents with  . Foot Injury    5th met left (along dorsal aspect) - hit toe and side of foot on door frame about 8 weeks ago, PCP evaluated initially-said just to give it time to heal-no better, so came here-resting with less activity   37 y.o. male presents with the above complaint.  Reports pain along the outside of the left foot at the area of the fifth metatarsal.  States that he hit his toe on a door from about 8 weeks ago.  Went to his PCP for evaluation x-rays were taken at that time and told to give a time.  She says it is not better and is following up with [place].  Does state that it was getting better recently.  Past Medical History:  Diagnosis Date  . Allergy   . Anxiety   . Asthma    exercise induced  . Pancreatitis    May 2012   Past Surgical History:  Procedure Laterality Date  . WISDOM TOOTH EXTRACTION      Current Outpatient Medications:  Marland Kitchen  Misc Natural Products (CHOLESTEROL SUPPORT PO), Take by mouth., Disp: , Rfl:  .  Multiple Vitamin (MULTIVITAMIN) capsule, Take 1 capsule by mouth daily., Disp: , Rfl:  .  Azelastine HCl (ASTEPRO NA), Place into the nose daily. , Disp: , Rfl:  .  levocetirizine (XYZAL) 5 MG tablet, Take 5 mg by mouth at bedtime. As needed, Disp: , Rfl: 1 .  LORazepam (ATIVAN) 0.5 MG tablet, TAKE 1 TABLET BY MOUTH EVERY 8 HOURS AS NEEDED, Disp: 60 tablet, Rfl: 5  Allergies  Allergen Reactions  . Other Anaphylaxis    Tree nuts-all of them  . 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  Objective:   Vitals:   06/05/17 0900  BP: 113/70  Pulse: 73  Resp: 16   General AA&O x3. Normal mood and affect.  Vascular Dorsalis pedis and posterior tibial pulses  present 2+ bilaterally  Capillary refill normal to all digits. Pedal hair growth normal.  Neurologic Epicritic sensation  grossly present.  Dermatologic No open lesions. Interspaces clear of maceration. Nails well groomed and normal in appearance.  Orthopedic: MMT 5/5 in dorsiflexion, plantarflexion, inversion, and eversion. Normal joint ROM without pain or crepitus.   Assessment & Plan:  Patient was evaluated and treated and all questions answered.  L 5th Metatarsal Contusion -Radiographs taken and reviewed no acute fractures dislocations. -No obvious osseous abnormality.  The patient that likely the pain would subside with time.  No discrete areas of pain today to inject.  Advised over-the-counter antifungal medicines as needed.  Advised for follow-up should the pain return.  No Follow-up on file.

## 2017-06-11 DIAGNOSIS — J3089 Other allergic rhinitis: Secondary | ICD-10-CM | POA: Diagnosis not present

## 2017-06-11 DIAGNOSIS — J301 Allergic rhinitis due to pollen: Secondary | ICD-10-CM | POA: Diagnosis not present

## 2017-06-11 DIAGNOSIS — J3081 Allergic rhinitis due to animal (cat) (dog) hair and dander: Secondary | ICD-10-CM | POA: Diagnosis not present

## 2017-06-24 DIAGNOSIS — J3089 Other allergic rhinitis: Secondary | ICD-10-CM | POA: Diagnosis not present

## 2017-06-24 DIAGNOSIS — J301 Allergic rhinitis due to pollen: Secondary | ICD-10-CM | POA: Diagnosis not present

## 2017-06-24 DIAGNOSIS — J3081 Allergic rhinitis due to animal (cat) (dog) hair and dander: Secondary | ICD-10-CM | POA: Diagnosis not present

## 2017-07-03 DIAGNOSIS — J3089 Other allergic rhinitis: Secondary | ICD-10-CM | POA: Diagnosis not present

## 2017-07-03 DIAGNOSIS — J301 Allergic rhinitis due to pollen: Secondary | ICD-10-CM | POA: Diagnosis not present

## 2017-07-03 DIAGNOSIS — J3081 Allergic rhinitis due to animal (cat) (dog) hair and dander: Secondary | ICD-10-CM | POA: Diagnosis not present

## 2017-07-09 ENCOUNTER — Telehealth: Payer: Self-pay | Admitting: Family Medicine

## 2017-07-09 NOTE — Telephone Encounter (Signed)
Copied from [place]. Topic: Quick Communication - See Telephone Encounter >> Jul 09, 2017  9:32 AM Lolita Rieger, RMA wrote: CRM for notification. See Telephone encounter for:   07/09/17.pt called and stated that several family members have tested positive for the flu and he would like to know something can be sent to his pharmacy for prevention pt pharmacy [facility] [address] and pt # [phone]

## 2017-07-09 NOTE — Telephone Encounter (Signed)
Please advise 

## 2017-07-10 MED ORDER — OSELTAMIVIR PHOSPHATE 75 MG PO CAPS: 75.0000 mg | ORAL_CAPSULE | Freq: Two times a day (BID) | ORAL | 0 refills | Status: DC

## 2017-07-10 NOTE — Telephone Encounter (Signed)
Medication filled to pharmacy as requested. Left message to notify patient. 

## 2017-07-10 NOTE — Telephone Encounter (Signed)
Call in Tamiflu 75 mg bid for 5 days  

## 2017-07-22 DIAGNOSIS — J3089 Other allergic rhinitis: Secondary | ICD-10-CM | POA: Diagnosis not present

## 2017-07-22 DIAGNOSIS — J301 Allergic rhinitis due to pollen: Secondary | ICD-10-CM | POA: Diagnosis not present

## 2017-07-22 DIAGNOSIS — J3081 Allergic rhinitis due to animal (cat) (dog) hair and dander: Secondary | ICD-10-CM | POA: Diagnosis not present

## 2017-07-23 DIAGNOSIS — J3081 Allergic rhinitis due to animal (cat) (dog) hair and dander: Secondary | ICD-10-CM | POA: Diagnosis not present

## 2017-07-23 DIAGNOSIS — J3089 Other allergic rhinitis: Secondary | ICD-10-CM | POA: Diagnosis not present

## 2017-08-11 DIAGNOSIS — J301 Allergic rhinitis due to pollen: Secondary | ICD-10-CM | POA: Diagnosis not present

## 2017-08-11 DIAGNOSIS — J3089 Other allergic rhinitis: Secondary | ICD-10-CM | POA: Diagnosis not present

## 2017-08-11 DIAGNOSIS — J3081 Allergic rhinitis due to animal (cat) (dog) hair and dander: Secondary | ICD-10-CM | POA: Diagnosis not present

## 2017-08-17 DIAGNOSIS — J301 Allergic rhinitis due to pollen: Secondary | ICD-10-CM | POA: Diagnosis not present

## 2017-08-17 DIAGNOSIS — J3081 Allergic rhinitis due to animal (cat) (dog) hair and dander: Secondary | ICD-10-CM | POA: Diagnosis not present

## 2017-08-17 DIAGNOSIS — J3089 Other allergic rhinitis: Secondary | ICD-10-CM | POA: Diagnosis not present

## 2017-09-04 DIAGNOSIS — J3081 Allergic rhinitis due to animal (cat) (dog) hair and dander: Secondary | ICD-10-CM | POA: Diagnosis not present

## 2017-09-04 DIAGNOSIS — J301 Allergic rhinitis due to pollen: Secondary | ICD-10-CM | POA: Diagnosis not present

## 2017-09-04 DIAGNOSIS — J3089 Other allergic rhinitis: Secondary | ICD-10-CM | POA: Diagnosis not present

## 2017-09-15 DIAGNOSIS — J301 Allergic rhinitis due to pollen: Secondary | ICD-10-CM | POA: Diagnosis not present

## 2017-09-15 DIAGNOSIS — J3081 Allergic rhinitis due to animal (cat) (dog) hair and dander: Secondary | ICD-10-CM | POA: Diagnosis not present

## 2017-09-15 DIAGNOSIS — J3089 Other allergic rhinitis: Secondary | ICD-10-CM | POA: Diagnosis not present

## 2017-09-17 DIAGNOSIS — J301 Allergic rhinitis due to pollen: Secondary | ICD-10-CM | POA: Diagnosis not present

## 2017-09-25 DIAGNOSIS — J3081 Allergic rhinitis due to animal (cat) (dog) hair and dander: Secondary | ICD-10-CM | POA: Diagnosis not present

## 2017-09-25 DIAGNOSIS — J301 Allergic rhinitis due to pollen: Secondary | ICD-10-CM | POA: Diagnosis not present

## 2017-09-25 DIAGNOSIS — J3089 Other allergic rhinitis: Secondary | ICD-10-CM | POA: Diagnosis not present

## 2017-09-28 DIAGNOSIS — J301 Allergic rhinitis due to pollen: Secondary | ICD-10-CM | POA: Diagnosis not present

## 2017-09-28 DIAGNOSIS — J3081 Allergic rhinitis due to animal (cat) (dog) hair and dander: Secondary | ICD-10-CM | POA: Diagnosis not present

## 2017-09-28 DIAGNOSIS — J3089 Other allergic rhinitis: Secondary | ICD-10-CM | POA: Diagnosis not present

## 2017-10-02 DIAGNOSIS — J3081 Allergic rhinitis due to animal (cat) (dog) hair and dander: Secondary | ICD-10-CM | POA: Diagnosis not present

## 2017-10-02 DIAGNOSIS — J3089 Other allergic rhinitis: Secondary | ICD-10-CM | POA: Diagnosis not present

## 2017-10-02 DIAGNOSIS — J301 Allergic rhinitis due to pollen: Secondary | ICD-10-CM | POA: Diagnosis not present

## 2017-10-06 DIAGNOSIS — J3081 Allergic rhinitis due to animal (cat) (dog) hair and dander: Secondary | ICD-10-CM | POA: Diagnosis not present

## 2017-10-06 DIAGNOSIS — J3089 Other allergic rhinitis: Secondary | ICD-10-CM | POA: Diagnosis not present

## 2017-10-06 DIAGNOSIS — J301 Allergic rhinitis due to pollen: Secondary | ICD-10-CM | POA: Diagnosis not present

## 2017-10-13 DIAGNOSIS — J3081 Allergic rhinitis due to animal (cat) (dog) hair and dander: Secondary | ICD-10-CM | POA: Diagnosis not present

## 2017-10-13 DIAGNOSIS — J3089 Other allergic rhinitis: Secondary | ICD-10-CM | POA: Diagnosis not present

## 2017-10-13 DIAGNOSIS — J301 Allergic rhinitis due to pollen: Secondary | ICD-10-CM | POA: Diagnosis not present

## 2017-10-21 DIAGNOSIS — J301 Allergic rhinitis due to pollen: Secondary | ICD-10-CM | POA: Diagnosis not present

## 2017-10-21 DIAGNOSIS — J3081 Allergic rhinitis due to animal (cat) (dog) hair and dander: Secondary | ICD-10-CM | POA: Diagnosis not present

## 2017-10-21 DIAGNOSIS — J3089 Other allergic rhinitis: Secondary | ICD-10-CM | POA: Diagnosis not present

## 2017-10-27 DIAGNOSIS — J301 Allergic rhinitis due to pollen: Secondary | ICD-10-CM | POA: Diagnosis not present

## 2017-10-27 DIAGNOSIS — J3089 Other allergic rhinitis: Secondary | ICD-10-CM | POA: Diagnosis not present

## 2017-10-27 DIAGNOSIS — J3081 Allergic rhinitis due to animal (cat) (dog) hair and dander: Secondary | ICD-10-CM | POA: Diagnosis not present

## 2017-11-03 DIAGNOSIS — J3089 Other allergic rhinitis: Secondary | ICD-10-CM | POA: Diagnosis not present

## 2017-11-03 DIAGNOSIS — J301 Allergic rhinitis due to pollen: Secondary | ICD-10-CM | POA: Diagnosis not present

## 2017-11-03 DIAGNOSIS — J3081 Allergic rhinitis due to animal (cat) (dog) hair and dander: Secondary | ICD-10-CM | POA: Diagnosis not present

## 2017-11-16 DIAGNOSIS — J3089 Other allergic rhinitis: Secondary | ICD-10-CM | POA: Diagnosis not present

## 2017-11-16 DIAGNOSIS — J3081 Allergic rhinitis due to animal (cat) (dog) hair and dander: Secondary | ICD-10-CM | POA: Diagnosis not present

## 2017-11-16 DIAGNOSIS — J301 Allergic rhinitis due to pollen: Secondary | ICD-10-CM | POA: Diagnosis not present

## 2017-11-19 ENCOUNTER — Encounter: Payer: 59 | Admitting: Family Medicine

## 2017-11-24 DIAGNOSIS — J3081 Allergic rhinitis due to animal (cat) (dog) hair and dander: Secondary | ICD-10-CM | POA: Diagnosis not present

## 2017-11-24 DIAGNOSIS — J301 Allergic rhinitis due to pollen: Secondary | ICD-10-CM | POA: Diagnosis not present

## 2017-11-24 DIAGNOSIS — J3089 Other allergic rhinitis: Secondary | ICD-10-CM | POA: Diagnosis not present

## 2017-12-04 DIAGNOSIS — J301 Allergic rhinitis due to pollen: Secondary | ICD-10-CM | POA: Diagnosis not present

## 2017-12-04 DIAGNOSIS — J3089 Other allergic rhinitis: Secondary | ICD-10-CM | POA: Diagnosis not present

## 2017-12-04 DIAGNOSIS — J3081 Allergic rhinitis due to animal (cat) (dog) hair and dander: Secondary | ICD-10-CM | POA: Diagnosis not present

## 2017-12-14 DIAGNOSIS — J3089 Other allergic rhinitis: Secondary | ICD-10-CM | POA: Diagnosis not present

## 2017-12-14 DIAGNOSIS — J301 Allergic rhinitis due to pollen: Secondary | ICD-10-CM | POA: Diagnosis not present

## 2017-12-14 DIAGNOSIS — J3081 Allergic rhinitis due to animal (cat) (dog) hair and dander: Secondary | ICD-10-CM | POA: Diagnosis not present

## 2017-12-15 ENCOUNTER — Encounter: Payer: 59 | Admitting: Family Medicine

## 2017-12-21 ENCOUNTER — Encounter: Payer: Self-pay | Admitting: Family Medicine

## 2017-12-21 ENCOUNTER — Ambulatory Visit (INDEPENDENT_AMBULATORY_CARE_PROVIDER_SITE_OTHER): Payer: 59 | Admitting: Family Medicine

## 2017-12-21 VITALS — BP 118/60 | HR 75 | Temp 97.6°F | Ht 71.0 in | Wt 168.5 lb

## 2017-12-21 DIAGNOSIS — Z0000 Encounter for general adult medical examination without abnormal findings: Secondary | ICD-10-CM

## 2017-12-21 DIAGNOSIS — J3089 Other allergic rhinitis: Secondary | ICD-10-CM | POA: Diagnosis not present

## 2017-12-21 DIAGNOSIS — J301 Allergic rhinitis due to pollen: Secondary | ICD-10-CM | POA: Diagnosis not present

## 2017-12-21 DIAGNOSIS — J3081 Allergic rhinitis due to animal (cat) (dog) hair and dander: Secondary | ICD-10-CM | POA: Diagnosis not present

## 2017-12-21 NOTE — Progress Notes (Signed)
   Subjective:    Patient ID: Raymond Bryant, male    DOB: 05-27-1980, 37 y.o.   MRN: [id number]  HPI Here for a well exam. He feels well except for some mild constipation and occasional flares of hemorrhoids. His stools are often dry and he has to strain at times to pass them. He may have some itching or burning around the anus. No blood in the stools.    Review of Systems  Constitutional: Negative.   HENT: Negative.   Eyes: Negative.   Respiratory: Negative.   Cardiovascular: Negative.   Gastrointestinal: Positive for constipation.  Genitourinary: Negative.   Musculoskeletal: Negative.   Skin: Negative.   Neurological: Negative.   Psychiatric/Behavioral: Negative.        Objective:   Physical Exam  Constitutional: He is oriented to person, place, and time. He appears well-developed and well-nourished. No distress.  HENT:  Head: Normocephalic and atraumatic.  Right Ear: External ear normal.  Left Ear: External ear normal.  Nose: Nose normal.  Mouth/Throat: Oropharynx is clear and moist. No oropharyngeal exudate.  Eyes: Pupils are equal, round, and reactive to light. Conjunctivae and EOM are normal. Right eye exhibits no discharge. Left eye exhibits no discharge. No scleral icterus.  Neck: Neck supple. No JVD present. No tracheal deviation present. No thyromegaly present.  Cardiovascular: Normal rate, regular rhythm, normal heart sounds and intact distal pulses. Exam reveals no gallop and no friction rub.  No murmur heard. Pulmonary/Chest: Effort normal and breath sounds normal. No respiratory distress. He has no wheezes. He has no rales. He exhibits no tenderness.  Abdominal: Soft. Bowel sounds are normal. He exhibits no distension and no mass. There is no tenderness. There is no rebound and no guarding.  Genitourinary: Rectum normal and penis normal. No penile tenderness.  Musculoskeletal: Normal range of motion. He exhibits no edema or tenderness.  Lymphadenopathy:    He has  no cervical adenopathy.  Neurological: He is alert and oriented to person, place, and time. He has normal reflexes. He displays normal reflexes. No cranial nerve deficit. He exhibits normal muscle tone. Coordination normal.  Skin: Skin is warm and dry. No rash noted. He is not diaphoretic. No erythema. No pallor.  Psychiatric: He has a normal mood and affect. His behavior is normal. Judgment and thought content normal.          Assessment & Plan:  Well exam. We discussed diet and exercise. Get fasting labs soon. For the constipation he will try Miralax daily.  Alysia Penna, MD

## 2017-12-22 ENCOUNTER — Other Ambulatory Visit: Payer: 59

## 2017-12-22 LAB — CBC WITH DIFFERENTIAL/PLATELET
BASOS PCT: 1.8 % (ref 0.0–3.0)
Basophils Absolute: 0.1 10*3/uL (ref 0.0–0.1)
Eosinophils Absolute: 0.3 10*3/uL (ref 0.0–0.7)
Eosinophils Relative: 5.8 % — ABNORMAL HIGH (ref 0.0–5.0)
HEMATOCRIT: 44.7 % (ref 39.0–52.0)
HEMOGLOBIN: 15.3 g/dL (ref 13.0–17.0)
LYMPHS PCT: 38.1 % (ref 12.0–46.0)
Lymphs Abs: 2.2 10*3/uL (ref 0.7–4.0)
MCHC: 34.2 g/dL (ref 30.0–36.0)
MCV: 87 fl (ref 78.0–100.0)
Monocytes Absolute: 0.6 10*3/uL (ref 0.1–1.0)
Monocytes Relative: 9.9 % (ref 3.0–12.0)
Neutro Abs: 2.5 10*3/uL (ref 1.4–7.7)
Neutrophils Relative %: 44.4 % (ref 43.0–77.0)
Platelets: 263 10*3/uL (ref 150.0–400.0)
RBC: 5.13 Mil/uL (ref 4.22–5.81)
RDW: 13 % (ref 11.5–15.5)
WBC: 5.7 10*3/uL (ref 4.0–10.5)

## 2017-12-22 LAB — HEPATIC FUNCTION PANEL
ALBUMIN: 4.5 g/dL (ref 3.5–5.2)
ALT: 11 U/L (ref 0–53)
AST: 13 U/L (ref 0–37)
Alkaline Phosphatase: 34 U/L — ABNORMAL LOW (ref 39–117)
Bilirubin, Direct: 0.1 mg/dL (ref 0.0–0.3)
Total Bilirubin: 1.1 mg/dL (ref 0.2–1.2)
Total Protein: 7.2 g/dL (ref 6.0–8.3)

## 2017-12-22 LAB — BASIC METABOLIC PANEL
BUN: 12 mg/dL (ref 6–23)
CHLORIDE: 97 meq/L (ref 96–112)
CO2: 34 meq/L — AB (ref 19–32)
Calcium: 9.4 mg/dL (ref 8.4–10.5)
Creatinine, Ser: 0.95 mg/dL (ref 0.40–1.50)
GFR: 93.96 mL/min (ref >60.00)
Glucose, Bld: 89 mg/dL (ref 70–99)
Potassium: 4 mEq/L (ref 3.5–5.1)
SODIUM: 136 meq/L (ref 135–145)

## 2017-12-22 LAB — POC URINALSYSI DIPSTICK (AUTOMATED)
Bilirubin, UA: NEGATIVE
Blood, UA: NEGATIVE
GLUCOSE UA: NEGATIVE
Ketones, UA: NEGATIVE
LEUKOCYTES UA: NEGATIVE
NITRITE UA: NEGATIVE
Protein, UA: NEGATIVE
SPEC GRAV UA: 1.01 (ref 1.010–1.025)
Urobilinogen, UA: 0.2 E.U./dL
pH, UA: 7 (ref 5.0–8.0)

## 2017-12-22 LAB — LIPID PANEL
CHOL/HDL RATIO: 4
Cholesterol: 174 mg/dL (ref 0–200)
HDL: 41.3 mg/dL (ref >39.00)
LDL CALC: 115 mg/dL — AB (ref 0–99)
NonHDL: 132.79
TRIGLYCERIDES: 87 mg/dL (ref 0.0–149.0)
VLDL: 17.4 mg/dL (ref 0.0–40.0)

## 2017-12-22 LAB — TSH: TSH: 1.84 u[IU]/mL (ref 0.35–4.50)

## 2017-12-22 NOTE — Addendum Note (Signed)
Addended by: Alysia Penna A on: 12/22/2017 08:37 AM   Modules accepted: Orders

## 2017-12-22 NOTE — Addendum Note (Signed)
Addended by: Elmer Picker on: 12/22/2017 08:47 AM   Modules accepted: Orders

## 2018-01-12 DIAGNOSIS — J3089 Other allergic rhinitis: Secondary | ICD-10-CM | POA: Diagnosis not present

## 2018-01-12 DIAGNOSIS — J301 Allergic rhinitis due to pollen: Secondary | ICD-10-CM | POA: Diagnosis not present

## 2018-01-12 DIAGNOSIS — J3081 Allergic rhinitis due to animal (cat) (dog) hair and dander: Secondary | ICD-10-CM | POA: Diagnosis not present

## 2018-01-19 DIAGNOSIS — J3081 Allergic rhinitis due to animal (cat) (dog) hair and dander: Secondary | ICD-10-CM | POA: Diagnosis not present

## 2018-01-19 DIAGNOSIS — J3089 Other allergic rhinitis: Secondary | ICD-10-CM | POA: Diagnosis not present

## 2018-01-19 DIAGNOSIS — J301 Allergic rhinitis due to pollen: Secondary | ICD-10-CM | POA: Diagnosis not present

## 2018-01-29 DIAGNOSIS — J3081 Allergic rhinitis due to animal (cat) (dog) hair and dander: Secondary | ICD-10-CM | POA: Diagnosis not present

## 2018-01-29 DIAGNOSIS — J301 Allergic rhinitis due to pollen: Secondary | ICD-10-CM | POA: Diagnosis not present

## 2018-01-29 DIAGNOSIS — J3089 Other allergic rhinitis: Secondary | ICD-10-CM | POA: Diagnosis not present

## 2018-02-05 DIAGNOSIS — J3081 Allergic rhinitis due to animal (cat) (dog) hair and dander: Secondary | ICD-10-CM | POA: Diagnosis not present

## 2018-02-05 DIAGNOSIS — J3089 Other allergic rhinitis: Secondary | ICD-10-CM | POA: Diagnosis not present

## 2018-02-05 DIAGNOSIS — J301 Allergic rhinitis due to pollen: Secondary | ICD-10-CM | POA: Diagnosis not present

## 2018-02-08 DIAGNOSIS — J3081 Allergic rhinitis due to animal (cat) (dog) hair and dander: Secondary | ICD-10-CM | POA: Diagnosis not present

## 2018-02-08 DIAGNOSIS — J3089 Other allergic rhinitis: Secondary | ICD-10-CM | POA: Diagnosis not present

## 2018-02-22 DIAGNOSIS — J301 Allergic rhinitis due to pollen: Secondary | ICD-10-CM | POA: Diagnosis not present

## 2018-02-22 DIAGNOSIS — J3081 Allergic rhinitis due to animal (cat) (dog) hair and dander: Secondary | ICD-10-CM | POA: Diagnosis not present

## 2018-02-22 DIAGNOSIS — J3089 Other allergic rhinitis: Secondary | ICD-10-CM | POA: Diagnosis not present

## 2018-03-08 ENCOUNTER — Other Ambulatory Visit: Payer: Self-pay | Admitting: Family Medicine

## 2018-03-08 DIAGNOSIS — J301 Allergic rhinitis due to pollen: Secondary | ICD-10-CM | POA: Diagnosis not present

## 2018-03-08 DIAGNOSIS — J3081 Allergic rhinitis due to animal (cat) (dog) hair and dander: Secondary | ICD-10-CM | POA: Diagnosis not present

## 2018-03-08 DIAGNOSIS — J3089 Other allergic rhinitis: Secondary | ICD-10-CM | POA: Diagnosis not present

## 2018-03-17 NOTE — Telephone Encounter (Signed)
Pt calling to check on this.  Pt states that he has no pills left.

## 2018-03-19 DIAGNOSIS — J3089 Other allergic rhinitis: Secondary | ICD-10-CM | POA: Diagnosis not present

## 2018-03-19 DIAGNOSIS — J301 Allergic rhinitis due to pollen: Secondary | ICD-10-CM | POA: Diagnosis not present

## 2018-03-19 DIAGNOSIS — J3081 Allergic rhinitis due to animal (cat) (dog) hair and dander: Secondary | ICD-10-CM | POA: Diagnosis not present

## 2018-03-23 MED FILL — LORazepam 0.5 MG TABS: 0.5 | 20 days supply | Qty: 60 | Fill #0

## 2018-03-23 NOTE — Telephone Encounter (Signed)
Call in #60 with 5 rf 

## 2018-03-23 NOTE — Telephone Encounter (Signed)
Refill has been called to the pharmacy.  

## 2018-03-23 NOTE — Telephone Encounter (Signed)
Dr. Fry please advise on refill. Thanks  

## 2018-03-24 DIAGNOSIS — J3089 Other allergic rhinitis: Secondary | ICD-10-CM | POA: Diagnosis not present

## 2018-03-24 DIAGNOSIS — J301 Allergic rhinitis due to pollen: Secondary | ICD-10-CM | POA: Diagnosis not present

## 2018-03-24 DIAGNOSIS — J3081 Allergic rhinitis due to animal (cat) (dog) hair and dander: Secondary | ICD-10-CM | POA: Diagnosis not present

## 2018-04-16 DIAGNOSIS — J3089 Other allergic rhinitis: Secondary | ICD-10-CM | POA: Diagnosis not present

## 2018-04-16 DIAGNOSIS — J301 Allergic rhinitis due to pollen: Secondary | ICD-10-CM | POA: Diagnosis not present

## 2018-04-16 DIAGNOSIS — J3081 Allergic rhinitis due to animal (cat) (dog) hair and dander: Secondary | ICD-10-CM | POA: Diagnosis not present

## 2018-04-19 DIAGNOSIS — H5201 Hypermetropia, right eye: Secondary | ICD-10-CM | POA: Diagnosis not present

## 2018-04-21 DIAGNOSIS — R05 Cough: Secondary | ICD-10-CM | POA: Diagnosis not present

## 2018-04-21 DIAGNOSIS — Z91010 Allergy to peanuts: Secondary | ICD-10-CM | POA: Diagnosis not present

## 2018-04-21 DIAGNOSIS — J301 Allergic rhinitis due to pollen: Secondary | ICD-10-CM | POA: Diagnosis not present

## 2018-04-21 DIAGNOSIS — J3089 Other allergic rhinitis: Secondary | ICD-10-CM | POA: Diagnosis not present

## 2018-04-21 MED FILL — PROAIR RESPICLICK INHAL PWD: 108 (90 BAS | 17 days supply | Qty: 1 | Fill #0

## 2018-04-23 ENCOUNTER — Ambulatory Visit: Payer: 59 | Admitting: Family Medicine

## 2018-04-23 ENCOUNTER — Encounter: Payer: Self-pay | Admitting: Family Medicine

## 2018-04-23 VITALS — BP 128/64 | HR 68 | Temp 98.1°F | Wt 171.5 lb

## 2018-04-23 DIAGNOSIS — R002 Palpitations: Secondary | ICD-10-CM | POA: Diagnosis not present

## 2018-04-23 NOTE — Progress Notes (Signed)
   Subjective:    Patient ID: Raymond Bryant, male    DOB: 09/21/1980, 37 y.o.   MRN: [id number]  HPI Here for increasing episodes of fluttering or rapid heartbeats which have picked up over the past 2 months. They used to last a few minutes but now they last up to several hours at a time. No chest pain or SOB. They are not related to exertion. He ad a well exam with labs here in August, including a TSH, and these were normal. He described some occasional anxiety then so he tried Lorazepam as needed. He says he no longer feels anxious at all, and the Lorazepam has no effects on these episodes. He drinks 1-2 cups of coffee a day, but no other caffeine. No energy drinks.    Review of Systems  Constitutional: Negative.   Respiratory: Negative.   Cardiovascular: Positive for palpitations. Negative for chest pain and leg swelling.  Neurological: Negative.   Psychiatric/Behavioral: Negative.        Objective:   Physical Exam Constitutional:      General: He is not in acute distress.    Appearance: Normal appearance.  Neck:     Musculoskeletal: Neck supple.  Cardiovascular:     Rate and Rhythm: Normal rate and regular rhythm.     Pulses: Normal pulses.     Heart sounds: Normal heart sounds.     Comments: EKG is normal  Pulmonary:     Effort: Pulmonary effort is normal. No respiratory distress.     Breath sounds: Normal breath sounds. No stridor. No wheezing, rhonchi or rales.  Lymphadenopathy:     Cervical: No cervical adenopathy.  Neurological:     General: No focal deficit present.     Mental Status: He is alert and oriented to person, place, and time.           Assessment & Plan:  Palpitations of uncertain etiology. We will set up a 48 hour Holter moniter to evaluate further.  Alysia Penna, MD

## 2018-05-13 ENCOUNTER — Telehealth: Payer: Self-pay | Admitting: *Deleted

## 2018-05-13 DIAGNOSIS — J3089 Other allergic rhinitis: Secondary | ICD-10-CM

## 2018-05-13 DIAGNOSIS — R002 Palpitations: Secondary | ICD-10-CM

## 2018-05-13 NOTE — Telephone Encounter (Signed)
Copied from [place] [id number]. Topic: Referral - Request for Referral >> May 13, 2018  4:43 PM Marin Olp L wrote: Has patient seen PCP for this complaint? Yes.   *If NO, is insurance requiring patient see PCP for this issue before PCP can refer them? NO Referral for which specialty: allergy/cardiology Preferred provider/office: Dr. Carloyn Jaeger Allergy/ wherever Dr. Sharlene Motts recommends Reason for referral: has been a patient of allergy office for a while but new insurance so he needs a new referral. The cardiologist he discussed with Sharlene Motts on last OV but no referral placed in Epic yet.

## 2018-05-14 DIAGNOSIS — J3089 Other allergic rhinitis: Secondary | ICD-10-CM

## 2018-05-14 HISTORY — DX: Other allergic rhinitis: J30.89

## 2018-05-14 NOTE — Telephone Encounter (Signed)
Both referrals were done

## 2018-05-17 ENCOUNTER — Ambulatory Visit: Payer: 59 | Admitting: Family Medicine

## 2018-06-01 ENCOUNTER — Ambulatory Visit: Payer: 59 | Admitting: Allergy and Immunology

## 2018-06-02 MED FILL — EPINEPHRINE 0.3 MG AUTO-INJ: 0.3 | 30 days supply | Qty: 2 | Fill #0

## 2018-06-22 ENCOUNTER — Ambulatory Visit: Payer: Self-pay | Admitting: *Deleted

## 2018-06-22 NOTE — Telephone Encounter (Signed)
Pt requesting Tamiflu. Son diagnosed with Type B flu today. Son's symptoms began Sunday night.  Pt is asymptomatic presently. If appropriate: [place].   Answer Assessment - Initial Assessment Questions 1. SYMPTOMS: "Do you have any symptoms?"     no 2. SEVERITY: If symptoms are present, ask "Are they mild, moderate or severe?"     n/a  Protocols used: MEDICATION QUESTION CALL-A-AH

## 2018-06-22 NOTE — Telephone Encounter (Signed)
Please advise. Ok to send tamiflu as preventative?

## 2018-06-23 ENCOUNTER — Other Ambulatory Visit: Payer: Self-pay | Admitting: Family Medicine

## 2018-06-23 MED ORDER — OSELTAMIVIR PHOSPHATE 75 MG PO CAPS: 75.0000 mg | ORAL_CAPSULE | Freq: Every day | ORAL | 0 refills | Status: DC

## 2018-06-23 NOTE — Telephone Encounter (Signed)
Ok to send tamiflu 75mg  daily x 7 days

## 2018-07-13 ENCOUNTER — Encounter: Payer: Self-pay | Admitting: Cardiology

## 2018-07-13 ENCOUNTER — Encounter (INDEPENDENT_AMBULATORY_CARE_PROVIDER_SITE_OTHER): Payer: Self-pay

## 2018-07-13 ENCOUNTER — Ambulatory Visit (INDEPENDENT_AMBULATORY_CARE_PROVIDER_SITE_OTHER): Payer: No Typology Code available for payment source | Admitting: Cardiology

## 2018-07-13 VITALS — BP 110/70 | HR 81 | Ht 71.0 in | Wt 165.1 lb

## 2018-07-13 DIAGNOSIS — R002 Palpitations: Secondary | ICD-10-CM

## 2018-07-13 NOTE — Patient Instructions (Signed)
Medication Instructions:  The current medical regimen is effective;  continue present plan and medications.  Follow-Up: Follow up as needed with Dr Skains.  Thank you for choosing [facility]!!     

## 2018-07-13 NOTE — Progress Notes (Signed)
Cardiology Office Note:    Date:  07/13/2018   ID:  Raymond Bryant, DOB [birth year], MRN [id number]  PCP:  Laurey Morale, MD  Cardiologist:  Candee Furbish, MD  Electrophysiologist:  None   Referring MD: Laurey Morale, MD     History of Present Illness:    Raymond Bryant is a 38 y.o. male palpitations at the request of Dr. Sarajane Jews.  Back in December he was having some extra skipping of his heartbeat.  Was had a sensation like he needed to stretch his right foot when he felt it.  No significant pain associated with this or shortness of breath.  Certainly no syncopal episodes.  This, dissipated on its own.  He has been taking red yeast rice and co-Q10 supplements for quite some time.  Been doing quite well with this.  Asked about any potential liver damage with this, no should not cause any.    His EKG shows heart rate of 89 bpm with no other abnormalities.  Back in 2015 he did have a stress echocardiogram performed at [facility] which was normal, normal ejection fraction nothing unusual with the structure and function of his heart or his exercise treadmill.  Reassurance.    Past Medical History:  Diagnosis Date  . Allergy   . Anxiety   . Asthma    exercise induced  . Chest pain 07/18/2013  . Dizziness 07/18/2013  . Environmental and seasonal allergies 05/14/2018  . GERD (gastroesophageal reflux disease) 12/30/2012  . Hyperlipidemia 07/18/2013  . Palpitations 07/18/2013  . Pancreatitis    May 2012    Past Surgical History:  Procedure Laterality Date  . WISDOM TOOTH EXTRACTION      Current Medications: Current Meds  Medication Sig  . EPINEPHrine 0.3 mg/0.3 mL IJ SOAJ injection   . levocetirizine (XYZAL) 5 MG tablet Take 5 mg by mouth at bedtime. As needed  . Misc Natural Products (CHOLESTEROL SUPPORT PO) Take by mouth.  . Multiple Vitamin (MULTIVITAMIN) capsule Take 1 capsule by mouth daily.     Allergies:   Other and Peanuts [peanut oil]   Social History   Socioeconomic History  .  Marital status: Single    Spouse name: Not on file  . Number of children: Not on file  . Years of education: Not on file  . Highest education level: Not on file  Occupational History  . Not on file  Social Needs  . Financial resource strain: Not on file  . Food insecurity:    Worry: Not on file    Inability: Not on file  . Transportation needs:    Medical: Not on file    Non-medical: Not on file  Tobacco Use  . Smoking status: Never Smoker  . Smokeless tobacco: Never Used  Substance and Sexual Activity  . Alcohol use: Yes    Alcohol/week: 0.0 standard drinks    Comment: couples times a month  . Drug use: No  . Sexual activity: Not on file  Lifestyle  . Physical activity:    Days per week: Not on file    Minutes per session: Not on file  . Stress: Not on file  Relationships  . Social connections:    Talks on phone: Not on file    Gets together: Not on file    Attends religious service: Not on file    Active member of club or organization: Not on file    Attends meetings of clubs or organizations: Not on file  Relationship status: Not on file  Other Topics Concern  . Not on file  Social History Narrative  . Not on file     Family History: The patient's family history includes Cancer in his father; Colon cancer in his father.  ROS:   Please see the history of present illness.   Denies any fevers chills nausea vomiting syncope bleeding  All other systems reviewed and are negative.  EKGs/Labs/Other Studies Reviewed:    The following studies were reviewed today:   EKG: Excellent as above.  Recent Labs: 12/22/2017: ALT 11; BUN 12; Creatinine, Ser 0.95; Hemoglobin 15.3; Platelets 263.0; Potassium 4.0; Sodium 136; TSH 1.84  Recent Lipid Panel    Component Value Date/Time   CHOL 174 12/22/2017 0847   TRIG 87.0 12/22/2017 0847   HDL 41.30 12/22/2017 0847   CHOLHDL 4 12/22/2017 0847   VLDL 17.4 12/22/2017 0847   LDLCALC 115 (H) 12/22/2017 0847   LDLDIRECT  189.2 04/21/2013 0810    Physical Exam:    VS:  BP 110/70   Pulse 81   Ht 5\' 11"  (1.803 m)   Wt 165 lb 1.9 oz (74.9 kg)   SpO2 99%   BMI 23.03 kg/m     Wt Readings from Last 3 Encounters:  07/13/18 165 lb 1.9 oz (74.9 kg)  04/23/18 171 lb 8 oz (77.8 kg)  12/21/17 168 lb 8 oz (76.4 kg)     GEN:  Thin in no acute distress HEENT: Normal NECK: No JVD; No carotid bruits LYMPHATICS: No lymphadenopathy CARDIAC: RRR, no murmurs, rubs, gallops RESPIRATORY:  Clear to auscultation without rales, wheezing or rhonchi  ABDOMEN: Soft, non-tender, non-distended MUSCULOSKELETAL:  No edema; No deformity  SKIN: Warm and dry NEUROLOGIC:  Alert and oriented x 3 PSYCHIATRIC:  Normal affect   ASSESSMENT:    1. Palpitations    PLAN:    In order of problems listed above:  Palpitations-no high risk symptoms such as syncope exertional chest discomfort.  No sustained arrhythmias.  Occasional skipped beats noted.  Reassurance has been given.  Try to limit caffeine, Sudafed, or other substances such as this.  Daily exercise helpful.  Enjoys mountain biking.  Prior echocardiogram stress echocardiogram EKG all reassuring.  He has had a syncopal episode in the past when he had been giving blood, vasovagal.  Otherwise unremarkable.  If his symptoms progress, we can always consider low-dose beta-blocker or diltiazem for instance to help extinguish palpitations.  For now, I would avoid extra medications.  Also, since he has not had any episodes over the past month or 2, it would be of low utility to have a Holter monitor or event monitor.    Medication Adjustments/Labs and Tests Ordered: Current medicines are reviewed at length with the patient today.  Concerns regarding medicines are outlined above.  No orders of the defined types were placed in this encounter.  No orders of the defined types were placed in this encounter.   Patient Instructions  Medication Instructions:  The current medical regimen  is effective;  continue present plan and medications.  Follow-Up: Follow up as needed with Dr Marlou Porch.  Thank you for choosing [facility]!!         Signed, Candee Furbish, MD  07/13/2018 4:05 PM    [facility]

## 2018-11-23 ENCOUNTER — Encounter: Payer: Self-pay | Admitting: *Deleted

## 2018-12-29 ENCOUNTER — Encounter: Payer: Self-pay | Admitting: Family Medicine

## 2018-12-29 ENCOUNTER — Ambulatory Visit (INDEPENDENT_AMBULATORY_CARE_PROVIDER_SITE_OTHER): Payer: No Typology Code available for payment source | Admitting: Family Medicine

## 2018-12-29 ENCOUNTER — Other Ambulatory Visit: Payer: Self-pay

## 2018-12-29 VITALS — BP 108/60 | HR 63 | Temp 97.7°F | Wt 163.0 lb

## 2018-12-29 DIAGNOSIS — Z0000 Encounter for general adult medical examination without abnormal findings: Secondary | ICD-10-CM

## 2018-12-29 LAB — LIPID PANEL
Cholesterol: 204 mg/dL — ABNORMAL HIGH (ref 0–200)
HDL: 40.3 mg/dL (ref >39.00)
LDL Cholesterol: 146 mg/dL — ABNORMAL HIGH (ref 0–99)
NonHDL: 163.46
Total CHOL/HDL Ratio: 5
Triglycerides: 89 mg/dL (ref 0.0–149.0)
VLDL: 17.8 mg/dL (ref 0.0–40.0)

## 2018-12-29 LAB — HEPATIC FUNCTION PANEL
ALT: 12 U/L (ref 0–53)
AST: 14 U/L (ref 0–37)
Albumin: 4.8 g/dL (ref 3.5–5.2)
Alkaline Phosphatase: 40 U/L (ref 39–117)
Bilirubin, Direct: 0.1 mg/dL (ref 0.0–0.3)
Total Bilirubin: 1 mg/dL (ref 0.2–1.2)
Total Protein: 7.4 g/dL (ref 6.0–8.3)

## 2018-12-29 LAB — POC URINALSYSI DIPSTICK (AUTOMATED)
Bilirubin, UA: NEGATIVE
Blood, UA: NEGATIVE
Glucose, UA: NEGATIVE
Ketones, UA: NEGATIVE
Leukocytes, UA: NEGATIVE
Nitrite, UA: NEGATIVE
Protein, UA: NEGATIVE
Spec Grav, UA: <=1.005 — AB (ref 1.010–1.025)
Urobilinogen, UA: 0.2 E.U./dL
pH, UA: 7 (ref 5.0–8.0)

## 2018-12-29 LAB — CBC WITH DIFFERENTIAL/PLATELET
Basophils Absolute: 0.1 10*3/uL (ref 0.0–0.1)
Basophils Relative: 1.6 % (ref 0.0–3.0)
Eosinophils Absolute: 0.3 10*3/uL (ref 0.0–0.7)
Eosinophils Relative: 5.2 % — ABNORMAL HIGH (ref 0.0–5.0)
HCT: 44.6 % (ref 39.0–52.0)
Hemoglobin: 15.2 g/dL (ref 13.0–17.0)
Lymphocytes Relative: 31.3 % (ref 12.0–46.0)
Lymphs Abs: 1.7 10*3/uL (ref 0.7–4.0)
MCHC: 34 g/dL (ref 30.0–36.0)
MCV: 87.6 fl (ref 78.0–100.0)
Monocytes Absolute: 0.4 10*3/uL (ref 0.1–1.0)
Monocytes Relative: 7.7 % (ref 3.0–12.0)
Neutro Abs: 3 10*3/uL (ref 1.4–7.7)
Neutrophils Relative %: 54.2 % (ref 43.0–77.0)
Platelets: 262 10*3/uL (ref 150.0–400.0)
RBC: 5.09 Mil/uL (ref 4.22–5.81)
RDW: 13.3 % (ref 11.5–15.5)
WBC: 5.6 10*3/uL (ref 4.0–10.5)

## 2018-12-29 LAB — BASIC METABOLIC PANEL
BUN: 11 mg/dL (ref 6–23)
CO2: 32 mEq/L (ref 19–32)
Calcium: 9.7 mg/dL (ref 8.4–10.5)
Chloride: 99 mEq/L (ref 96–112)
Creatinine, Ser: 0.91 mg/dL (ref 0.40–1.50)
GFR: 92.42 mL/min (ref >60.00)
Glucose, Bld: 92 mg/dL (ref 70–99)
Potassium: 4.2 mEq/L (ref 3.5–5.1)
Sodium: 139 mEq/L (ref 135–145)

## 2018-12-29 LAB — TSH: TSH: 2.05 u[IU]/mL (ref 0.35–4.50)

## 2018-12-29 NOTE — Progress Notes (Signed)
   Subjective:    Patient ID: Raymond Bryant, male    DOB: [birth year], 38 y.o.   MRN: [id number]  HPI Here for a well exam. He feels fine except for his allergies. He had stopped his allergy shots but he has decided to resume these.    Review of Systems  Constitutional: Negative.   HENT: Negative.   Eyes: Negative.   Respiratory: Negative.   Cardiovascular: Negative.   Gastrointestinal: Negative.   Genitourinary: Negative.   Musculoskeletal: Negative.   Skin: Negative.   Neurological: Negative.   Psychiatric/Behavioral: Negative.        Objective:   Physical Exam Constitutional:      General: He is not in acute distress.    Appearance: He is well-developed. He is not diaphoretic.  HENT:     Head: Normocephalic and atraumatic.     Right Ear: External ear normal.     Left Ear: External ear normal.     Nose: Nose normal.     Mouth/Throat:     Pharynx: No oropharyngeal exudate.  Eyes:     General: No scleral icterus.       Right eye: No discharge.        Left eye: No discharge.     Conjunctiva/sclera: Conjunctivae normal.     Pupils: Pupils are equal, round, and reactive to light.  Neck:     Musculoskeletal: Neck supple.     Thyroid: No thyromegaly.     Vascular: No JVD.     Trachea: No tracheal deviation.  Cardiovascular:     Rate and Rhythm: Normal rate and regular rhythm.     Heart sounds: Normal heart sounds. No murmur. No friction rub. No gallop.   Pulmonary:     Effort: Pulmonary effort is normal. No respiratory distress.     Breath sounds: Normal breath sounds. No wheezing or rales.  Chest:     Chest wall: No tenderness.  Abdominal:     General: Bowel sounds are normal. There is no distension.     Palpations: Abdomen is soft. There is no mass.     Tenderness: There is no abdominal tenderness. There is no guarding or rebound.  Genitourinary:    Penis: Normal. No tenderness.      Scrotum/Testes: Normal.  Musculoskeletal: Normal range of motion.         General: No tenderness.  Lymphadenopathy:     Cervical: No cervical adenopathy.  Skin:    General: Skin is warm and dry.     Coloration: Skin is not pale.     Findings: No erythema or rash.  Neurological:     Mental Status: He is alert and oriented to person, place, and time.     Cranial Nerves: No cranial nerve deficit.     Motor: No abnormal muscle tone.     Coordination: Coordination normal.     Deep Tendon Reflexes: Reflexes are normal and symmetric. Reflexes normal.  Psychiatric:        Behavior: Behavior normal.        Thought Content: Thought content normal.        Judgment: Judgment normal.           Assessment & Plan:  Well exam. We discussed diet and exercise. Get fasting labs.  Alysia Penna, MD

## 2019-04-18 ENCOUNTER — Other Ambulatory Visit: Payer: Self-pay

## 2019-04-18 ENCOUNTER — Telehealth (INDEPENDENT_AMBULATORY_CARE_PROVIDER_SITE_OTHER): Payer: No Typology Code available for payment source | Admitting: Family Medicine

## 2019-04-18 ENCOUNTER — Encounter: Payer: Self-pay | Admitting: Family Medicine

## 2019-04-18 VITALS — Temp 97.7°F | Ht 71.0 in | Wt 163.0 lb

## 2019-04-18 DIAGNOSIS — K649 Unspecified hemorrhoids: Secondary | ICD-10-CM | POA: Diagnosis not present

## 2019-04-18 MED ORDER — HYDROCORTISONE ACETATE 25 MG RE SUPP: 25.0000 mg | Freq: Two times a day (BID) | RECTAL | 2 refills | Status: DC

## 2019-04-18 MED FILL — HYDROCORTISONE AC 25 MG SUP: 25 | 12 days supply | Qty: 24 | Fill #0

## 2019-04-18 NOTE — Progress Notes (Signed)
Virtual Visit via Video Note  I connected with the patient on 04/18/19 at 11:00 AM EST by a video enabled telemedicine application and verified that I am speaking with the correct person using two identifiers.  Location patient: home Location provider:work or home office Persons participating in the virtual visit: patient, provider  I discussed the limitations of evaluation and management by telemedicine and the availability of in person appointments. The patient expressed understanding and agreed to proceed.   HPI: Here for what he thinks are hemorrhoids. He sometimes has mild pain when he passes a stool and he has felt discomfort just inside the anus. No bleeding. He is using Preparation H with no relief. He has [place] started using Miralax.    ROS: See pertinent positives and negatives per HPI.  Past Medical History:  Diagnosis Date  . Allergy   . Anxiety   . Asthma    exercise induced  . 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 Atypical nevus 08/23/2015   LEFT CALF (MILD)- TX CLEAR  . Chest pain 07/18/2013  . Dizziness 07/18/2013  . Environmental and seasonal allergies 05/14/2018  . GERD (gastroesophageal reflux disease) 12/30/2012  . Hyperlipidemia 07/18/2013  . 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 Palpitations 07/18/2013  . Pancreatitis    May 2012    Past Surgical History:  Procedure Laterality Date  . WISDOM TOOTH EXTRACTION      Family History  Problem Relation Age of Onset  . Cancer Father   . Colon cancer Father      Current Outpatient Medications:  .  EPINEPHrine 0.3 mg/0.3 mL IJ SOAJ injection, , Disp: , Rfl:  .  levocetirizine (XYZAL) 5 MG tablet, Take 5 mg by mouth at bedtime. As needed, Disp: , Rfl: 1 .  Misc Natural Products (CHOLESTEROL SUPPORT PO), Take by mouth., Disp: , Rfl:  .  Multiple Vitamin (MULTIVITAMIN) capsule, Take 1 capsule by mouth daily., Disp: , Rfl:  .   hydrocortisone (ANUSOL-HC) 25 MG suppository, Place 1 suppository (25 mg total) rectally 2 (two) times daily., Disp: 24 suppository, Rfl: 2  EXAM:  VITALS per patient if applicable:  GENERAL: alert, oriented, appears well and in no acute distress  HEENT: atraumatic, conjunttiva clear, no obvious abnormalities on inspection of external nose and ears  NECK: normal movements of the head and neck  LUNGS: on inspection no signs of respiratory distress, breathing rate appears normal, no obvious gross SOB, gasping or wheezing  CV: no obvious cyanosis  MS: moves all visible extremities without noticeable abnormality  PSYCH/NEURO: pleasant and cooperative, no obvious depression or anxiety, speech and thought processing grossly intact  ASSESSMENT AND PLAN: These are likely hemorrhoids. Stay on Miralax. He can try hydrocortisone suppositories prn.  Alysia Penna, MD  Discussed the following assessment and plan:  No diagnosis found.     I discussed the assessment and treatment plan with the patient. The patient was provided an opportunity to ask questions and all were answered. The patient agreed with the plan and demonstrated an understanding of the instructions.   The patient was advised to call back or seek an in-person evaluation if the symptoms worsen or if the condition fails to improve as anticipated.

## 2019-04-29 MED FILL — HYDROCORTISONE AC 25 MG SUP: 25 | 12 days supply | Qty: 24 | Fill #1

## 2019-05-03 ENCOUNTER — Other Ambulatory Visit: Payer: Self-pay | Admitting: Family Medicine

## 2019-05-03 MED FILL — LORazepam 0.5 MG TABS: 0.5 | 20 days supply | Qty: 60 | Fill #0

## 2019-05-03 NOTE — Telephone Encounter (Signed)
Last filled 03/23/18 Last OV 12/29/2018  Not on current med list.

## 2019-05-11 MED FILL — HYDROCORTISONE AC 25 MG SUP: 25 | 12 days supply | Qty: 24 | Fill #2

## 2019-05-17 ENCOUNTER — Other Ambulatory Visit: Payer: Self-pay

## 2019-05-18 ENCOUNTER — Ambulatory Visit (INDEPENDENT_AMBULATORY_CARE_PROVIDER_SITE_OTHER): Payer: No Typology Code available for payment source | Admitting: Family Medicine

## 2019-05-18 ENCOUNTER — Encounter: Payer: Self-pay | Admitting: Family Medicine

## 2019-05-18 VITALS — BP 126/60 | HR 93 | Temp 97.9°F | Wt 168.6 lb

## 2019-05-18 DIAGNOSIS — K6289 Other specified diseases of anus and rectum: Secondary | ICD-10-CM | POA: Diagnosis not present

## 2019-05-18 DIAGNOSIS — Z8042 Family history of malignant neoplasm of prostate: Secondary | ICD-10-CM | POA: Diagnosis not present

## 2019-05-18 NOTE — Progress Notes (Signed)
   Subjective:    Patient ID: Raymond Bryant, male    DOB: [birth year], 39 y.o.   MRN: [id number]  HPI Here to followup on rectal irritation. We had a virtual visit on 04-18-19 about several weeks of irritation he was feeling which had not responded to Preparation H. He was given hydrocortisone suppositories and these have worked very well. The irritation is gone. He is still using one suppository a day. He is also using Miralax daily, and his stools are soft and comfortable to pass. No bleeding has been seen.    Review of Systems  Constitutional: Negative.   Respiratory: Negative.   Cardiovascular: Negative.   Gastrointestinal: Positive for rectal pain. Negative for anal bleeding and blood in stool.       Objective:   Physical Exam Constitutional:      Appearance: Normal appearance.  Cardiovascular:     Rate and Rhythm: Normal rate and regular rhythm.     Pulses: Normal pulses.     Heart sounds: Normal heart sounds.  Pulmonary:     Effort: Pulmonary effort is normal.     Breath sounds: Normal breath sounds.  Genitourinary:    Comments: The rectum appears normal. Internal exam is normal with no masses or tenderness. Prostate is normal  Neurological:     Mental Status: He is alert.           Assessment & Plan:  Proctitis, now resolved. He will stop the suppositories. Stay on Miralax and recheck prn.  Alysia Penna, MD

## 2019-05-19 LAB — PSA: PSA: 0.71 ng/mL (ref 0.10–4.00)

## 2019-09-21 ENCOUNTER — Encounter: Payer: Self-pay | Admitting: Family Medicine

## 2019-09-21 NOTE — Telephone Encounter (Signed)
This does sound as if he popped out a hemorrhoid. I would keep using the suppositories for several days. If this does not work he can come back in

## 2019-09-22 ENCOUNTER — Other Ambulatory Visit: Payer: Self-pay | Admitting: Family Medicine

## 2020-01-18 ENCOUNTER — Encounter: Payer: Self-pay | Admitting: Family Medicine

## 2020-01-18 ENCOUNTER — Telehealth (INDEPENDENT_AMBULATORY_CARE_PROVIDER_SITE_OTHER): Payer: No Typology Code available for payment source | Admitting: Family Medicine

## 2020-01-18 VITALS — Temp 100.8°F | Ht 71.0 in | Wt 168.0 lb

## 2020-01-18 DIAGNOSIS — R5383 Other fatigue: Secondary | ICD-10-CM | POA: Diagnosis not present

## 2020-01-18 DIAGNOSIS — R509 Fever, unspecified: Secondary | ICD-10-CM

## 2020-01-18 NOTE — Progress Notes (Signed)
Patient ID: Arling Cerone, male   DOB: 02-10-1981, 39 y.o.   MRN: [id number]  This visit type was conducted due to national recommendations for restrictions regarding the COVID-19 pandemic in an effort to limit this patient's exposure and mitigate transmission in our community.   Virtual Visit via Video Note  I connected with Leona Singleton on 01/18/20 at  1:45 PM EDT by a video enabled telemedicine application and verified that I am speaking with the correct person using two identifiers.  Location patient: home Location provider:work or home office Persons participating in the virtual visit: patient, provider  I discussed the limitations of evaluation and management by telemedicine and the availability of in person appointments. The patient expressed understanding and agreed to proceed.   HPI: Spyros called with onset yesterday of malaise and fever ranging between 100 and-101.  He had some mild postnasal drip symptoms.  Mild low back pain but no dysuria.  Mild body aches.  No cough.  No headaches.  No loss of taste or smell.  He lives with his wife and children and they were at the beach last week but stayed relatively isolated.  He has not been vaccinated.  No nausea, vomiting, or diarrhea.  Generally healthy.  No dyspnea at this time.   ROS: See pertinent positives and negatives per HPI.  Past Medical History:  Diagnosis Date  . Allergy   . Anxiety   . Asthma    exercise induced  . 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 Atypical nevus 08/23/2015   LEFT CALF (MILD)- TX CLEAR  . Chest pain 07/18/2013  . Dizziness 07/18/2013  . Environmental and seasonal allergies 05/14/2018  . GERD (gastroesophageal reflux disease) 12/30/2012  . Hyperlipidemia 07/18/2013  . 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 Palpitations 07/18/2013  . Pancreatitis    May 2012    Past Surgical History:  Procedure Laterality Date  .  WISDOM TOOTH EXTRACTION      Family History  Problem Relation Age of Onset  . Cancer Father   . Colon cancer Father     SOCIAL HX: Non-smoker   Current Outpatient Medications:  .  EPINEPHrine 0.3 mg/0.3 mL IJ SOAJ injection, , Disp: , Rfl:  .  hydrocortisone (ANUSOL-HC) 25 MG suppository, UNWRAP AND INSERT 1 SUPPOSITORY RECTALLY TWICE DAILY, Disp: 24 suppository, Rfl: 2 .  levocetirizine (XYZAL) 5 MG tablet, Take 5 mg by mouth at bedtime. As needed, Disp: , Rfl: 1 .  LORazepam (ATIVAN) 0.5 MG tablet, TAKE 1 TABLET BY MOUTH EVERY 8 HOURS AS NEEDED, Disp: 60 tablet, Rfl: 5 .  Misc Natural Products (CHOLESTEROL SUPPORT PO), Take by mouth., Disp: , Rfl:  .  Multiple Vitamin (MULTIVITAMIN) capsule, Take 1 capsule by mouth daily., Disp: , Rfl:   EXAM:  VITALS per patient if applicable:  GENERAL: alert, oriented, appears well and in no acute distress  HEENT: atraumatic, conjunttiva clear, no obvious abnormalities on inspection of external nose and ears  NECK: normal movements of the head and neck  LUNGS: on inspection no signs of respiratory distress, breathing rate appears normal, no obvious gross SOB, gasping or wheezing  CV: no obvious cyanosis  MS: moves all visible extremities without noticeable abnormality  PSYCH/NEURO: pleasant and cooperative, no obvious depression or anxiety, speech and thought processing grossly intact  ASSESSMENT AND PLAN:  Discussed the following assessment and plan:  Patient has 1  day history of fever, myalgias, mild congestion.  In no respiratory distress  -Recommend Covid testing -Recommend plenty of fluids and hydration -Follow-up for any dyspnea or other concerns -He is aware to stay quarantined until further clarified and if positive for Covid minimum of 10 days from onset of symptoms     I discussed the assessment and treatment plan with the patient. The patient was provided an opportunity to ask questions and all were answered. The  patient agreed with the plan and demonstrated an understanding of the instructions.   The patient was advised to call back or seek an in-person evaluation if the symptoms worsen or if the condition fails to improve as anticipated.     Carolann Littler, MD

## 2020-01-19 ENCOUNTER — Telehealth: Payer: Self-pay | Admitting: Family Medicine

## 2020-01-19 NOTE — Telephone Encounter (Signed)
Pt saw Dr. Elease Hashimoto virtually yesterday.

## 2020-01-19 NOTE — Telephone Encounter (Signed)
Spoke to pt and he stated that his sx are not as bad right now. I advised pt that Dr. Elease Hashimoto is out of the office on Thursdays but I can send the message to Dr.Fry. Pt said his sx are managed at this point but had a question about a medication called Ivermectin. Pt stated his wife was told it could help. Please advise. Pt also advised to call back if sx worsens.

## 2020-01-19 NOTE — Telephone Encounter (Signed)
Pt is calling in to let Dr. Elease Hashimoto know that his COVID results were positive and wanted to know what he should do from this point.  New symptoms are congestion in throat (sore), food taste different.  Pt would like to have a call back.

## 2020-01-20 NOTE — Telephone Encounter (Signed)
FYI  Spoke to pt and she stated that he is getting more fatigued. PT stated his throat is more sore and he was worried about going through the weekend without any medication or help. Pt advised that we do have a Sat. Clinic if he needs a VV and the IC was available as well. Pt verbalized understanding. Pt stated that he will hold off on medication until he really needs it.

## 2020-01-20 NOTE — Telephone Encounter (Signed)
Tell him there is NO evidence that Ivermectin can help at all, and it can have serious side effects. We do NOT prescribe this

## 2020-01-20 NOTE — Telephone Encounter (Signed)
Pt wants to know if there is anything he can take for his symptoms. Over the counter or prescribed. Feeling weaker today.

## 2020-01-21 ENCOUNTER — Ambulatory Visit (HOSPITAL_COMMUNITY)
Admission: RE | Admit: 2020-01-21 | Discharge: 2020-01-21 | Disposition: A | Discharge status: Home / Self Care | Payer: No Typology Code available for payment source | Source: Ambulatory Visit | Attending: Pulmonary Disease | Admitting: Pulmonary Disease

## 2020-01-21 ENCOUNTER — Telehealth: Payer: Self-pay | Admitting: Infectious Diseases

## 2020-01-21 ENCOUNTER — Other Ambulatory Visit: Payer: Self-pay | Admitting: Infectious Diseases

## 2020-01-21 DIAGNOSIS — U071 COVID-19: Secondary | ICD-10-CM | POA: Diagnosis present

## 2020-01-21 DIAGNOSIS — Z609 Problem related to social environment, unspecified: Secondary | ICD-10-CM

## 2020-01-21 MED ORDER — DIPHENHYDRAMINE HCL 50 MG/ML IJ SOLN: 50.0000 mg | Freq: Once | INTRAMUSCULAR | Status: DC | PRN

## 2020-01-21 MED ORDER — FAMOTIDINE IN NACL 20-0.9 MG/50ML-% IV SOLN: 20.0000 mg | Freq: Once | INTRAVENOUS | Status: DC | PRN

## 2020-01-21 MED ORDER — SODIUM CHLORIDE 0.9 % IV SOLN: INTRAVENOUS | Status: DC | PRN

## 2020-01-21 MED ORDER — SODIUM CHLORIDE 0.9 % IV SOLN
1200.0000 mg | Freq: Once | INTRAVENOUS | Status: AC
  Administered 2020-01-21: 1200 mg via INTRAVENOUS
  Filled 2020-01-21: qty 10

## 2020-01-21 MED ORDER — METHYLPREDNISOLONE SODIUM SUCC 125 MG IJ SOLR: 125.0000 mg | Freq: Once | INTRAMUSCULAR | Status: DC | PRN

## 2020-01-21 MED ORDER — EPINEPHRINE 0.3 MG/0.3ML IJ SOAJ: 0.3000 mg | Freq: Once | INTRAMUSCULAR | Status: DC | PRN

## 2020-01-21 MED ORDER — ALBUTEROL SULFATE HFA 108 (90 BASE) MCG/ACT IN AERS: 2.0000 | INHALATION_SPRAY | Freq: Once | RESPIRATORY_TRACT | Status: DC | PRN

## 2020-01-21 NOTE — Addendum Note (Signed)
Addended by: Enon Callas on: 01/21/2020 08:42 AM   Modules accepted: Orders, SmartSet

## 2020-01-21 NOTE — Telephone Encounter (Signed)
Called to Discuss with patient about Covid symptoms and the use of the monoclonal antibody infusion for those with mild to moderate Covid symptoms and at a high risk of hospitalization.     Pt appears to qualify for this infusion due to co-morbid conditions and/or a member of an at-risk group in accordance with the FDA Emergency Use Authorization.   Sx started Tuesday evening  Risk factors include unvaccinated status

## 2020-01-21 NOTE — Progress Notes (Signed)
I connected by phone with Raymond Bryant on 01/21/2020 at 8:39 AM to discuss the potential use of a new treatment for mild to moderate COVID-19 viral infection in non-hospitalized patients.  This patient is a 39 y.o. male that meets the FDA criteria for Emergency Use Authorization of COVID monoclonal antibody casirivimab/imdevimab.  Has a (+) direct SARS-CoV-2 viral test result  Has mild or moderate COVID-19   Is NOT hospitalized due to COVID-19  Is within 10 days of symptom onset  Has at least one of the high risk factor(s) for progression to severe COVID-19 and/or hospitalization as defined in EUA.  Specific high risk criteria : Other high risk medical condition per CDC:  unvaccinated   I have spoken and communicated the following to the patient or parent/caregiver regarding COVID monoclonal antibody treatment:  1. FDA has authorized the emergency use for the treatment of mild to moderate COVID-19 in adults and pediatric patients with positive results of direct SARS-CoV-2 viral testing who are 34 years of age and older weighing at least 40 kg, and who are at high risk for progressing to severe COVID-19 and/or hospitalization.  2. The significant known and potential risks and benefits of COVID monoclonal antibody, and the extent to which such potential risks and benefits are unknown.  3. Information on available alternative treatments and the risks and benefits of those alternatives, including clinical trials.  4. Patients treated with COVID monoclonal antibody should continue to self-isolate and use infection control measures (e.g., wear mask, isolate, social distance, avoid sharing personal items, clean and disinfect "high touch" surfaces, and frequent handwashing) according to CDC guidelines.   5. The patient or parent/caregiver has the option to accept or refuse COVID monoclonal antibody treatment.  After reviewing this information with the patient, The patient agreed to proceed with  receiving casirivimab\imdevimab infusion and will be provided a copy of the Fact sheet prior to receiving the infusion. [name] 01/21/2020 8:39 AM

## 2020-01-21 NOTE — Discharge Instructions (Signed)

## 2020-01-23 ENCOUNTER — Encounter: Payer: Self-pay | Admitting: Family Medicine

## 2020-01-24 NOTE — Telephone Encounter (Signed)
Normally a quarantine should last 10 days from the start of symptoms

## 2020-04-30 ENCOUNTER — Other Ambulatory Visit (HOSPITAL_COMMUNITY): Payer: Self-pay | Admitting: Allergy

## 2020-04-30 MED FILL — ALBUTEROL SULFATE HFA 108 (: 108 (90 BAS | 16 days supply | Qty: 18 | Fill #0

## 2020-06-29 DIAGNOSIS — J3089 Other allergic rhinitis: Secondary | ICD-10-CM | POA: Diagnosis not present

## 2020-06-29 DIAGNOSIS — J301 Allergic rhinitis due to pollen: Secondary | ICD-10-CM | POA: Diagnosis not present

## 2020-06-29 DIAGNOSIS — J3081 Allergic rhinitis due to animal (cat) (dog) hair and dander: Secondary | ICD-10-CM | POA: Diagnosis not present

## 2020-07-03 DIAGNOSIS — J3081 Allergic rhinitis due to animal (cat) (dog) hair and dander: Secondary | ICD-10-CM | POA: Diagnosis not present

## 2020-07-03 DIAGNOSIS — J301 Allergic rhinitis due to pollen: Secondary | ICD-10-CM | POA: Diagnosis not present

## 2020-07-03 DIAGNOSIS — J3089 Other allergic rhinitis: Secondary | ICD-10-CM | POA: Diagnosis not present

## 2020-07-12 DIAGNOSIS — J301 Allergic rhinitis due to pollen: Secondary | ICD-10-CM | POA: Diagnosis not present

## 2020-07-12 DIAGNOSIS — J3089 Other allergic rhinitis: Secondary | ICD-10-CM | POA: Diagnosis not present

## 2020-07-12 DIAGNOSIS — J3081 Allergic rhinitis due to animal (cat) (dog) hair and dander: Secondary | ICD-10-CM | POA: Diagnosis not present

## 2020-07-17 DIAGNOSIS — J3081 Allergic rhinitis due to animal (cat) (dog) hair and dander: Secondary | ICD-10-CM | POA: Diagnosis not present

## 2020-07-17 DIAGNOSIS — J301 Allergic rhinitis due to pollen: Secondary | ICD-10-CM | POA: Diagnosis not present

## 2020-07-17 DIAGNOSIS — J3089 Other allergic rhinitis: Secondary | ICD-10-CM | POA: Diagnosis not present

## 2020-07-27 DIAGNOSIS — J3081 Allergic rhinitis due to animal (cat) (dog) hair and dander: Secondary | ICD-10-CM | POA: Diagnosis not present

## 2020-07-27 DIAGNOSIS — J301 Allergic rhinitis due to pollen: Secondary | ICD-10-CM | POA: Diagnosis not present

## 2020-07-27 DIAGNOSIS — J3089 Other allergic rhinitis: Secondary | ICD-10-CM | POA: Diagnosis not present

## 2020-08-02 ENCOUNTER — Other Ambulatory Visit (HOSPITAL_BASED_OUTPATIENT_CLINIC_OR_DEPARTMENT_OTHER): Payer: Self-pay

## 2020-08-08 DIAGNOSIS — J3089 Other allergic rhinitis: Secondary | ICD-10-CM | POA: Diagnosis not present

## 2020-08-08 DIAGNOSIS — J301 Allergic rhinitis due to pollen: Secondary | ICD-10-CM | POA: Diagnosis not present

## 2020-08-08 DIAGNOSIS — J3081 Allergic rhinitis due to animal (cat) (dog) hair and dander: Secondary | ICD-10-CM | POA: Diagnosis not present

## 2020-08-15 ENCOUNTER — Other Ambulatory Visit (HOSPITAL_COMMUNITY): Payer: Self-pay

## 2020-08-15 DIAGNOSIS — J301 Allergic rhinitis due to pollen: Secondary | ICD-10-CM | POA: Diagnosis not present

## 2020-08-15 DIAGNOSIS — J3089 Other allergic rhinitis: Secondary | ICD-10-CM | POA: Diagnosis not present

## 2020-08-15 DIAGNOSIS — J3081 Allergic rhinitis due to animal (cat) (dog) hair and dander: Secondary | ICD-10-CM | POA: Diagnosis not present

## 2020-08-17 ENCOUNTER — Other Ambulatory Visit (HOSPITAL_COMMUNITY): Payer: Self-pay

## 2020-08-23 DIAGNOSIS — J301 Allergic rhinitis due to pollen: Secondary | ICD-10-CM | POA: Diagnosis not present

## 2020-08-23 DIAGNOSIS — J3081 Allergic rhinitis due to animal (cat) (dog) hair and dander: Secondary | ICD-10-CM | POA: Diagnosis not present

## 2020-08-23 DIAGNOSIS — J3089 Other allergic rhinitis: Secondary | ICD-10-CM | POA: Diagnosis not present

## 2020-09-13 DIAGNOSIS — J3089 Other allergic rhinitis: Secondary | ICD-10-CM | POA: Diagnosis not present

## 2020-09-13 DIAGNOSIS — J3081 Allergic rhinitis due to animal (cat) (dog) hair and dander: Secondary | ICD-10-CM | POA: Diagnosis not present

## 2020-09-13 DIAGNOSIS — J301 Allergic rhinitis due to pollen: Secondary | ICD-10-CM | POA: Diagnosis not present

## 2020-09-24 DIAGNOSIS — J3081 Allergic rhinitis due to animal (cat) (dog) hair and dander: Secondary | ICD-10-CM | POA: Diagnosis not present

## 2020-09-24 DIAGNOSIS — J3089 Other allergic rhinitis: Secondary | ICD-10-CM | POA: Diagnosis not present

## 2020-09-24 DIAGNOSIS — J301 Allergic rhinitis due to pollen: Secondary | ICD-10-CM | POA: Diagnosis not present

## 2020-10-16 ENCOUNTER — Other Ambulatory Visit: Payer: Self-pay

## 2020-10-16 ENCOUNTER — Encounter: Payer: Self-pay | Admitting: Dermatology

## 2020-10-16 ENCOUNTER — Ambulatory Visit: Payer: 59 | Admitting: Dermatology

## 2020-10-16 DIAGNOSIS — Z1283 Encounter for screening for malignant neoplasm of skin: Secondary | ICD-10-CM

## 2020-10-16 DIAGNOSIS — D1801 Hemangioma of skin and subcutaneous tissue: Secondary | ICD-10-CM | POA: Diagnosis not present

## 2020-10-16 DIAGNOSIS — Z86006 Personal history of melanoma in-situ: Secondary | ICD-10-CM

## 2020-10-16 DIAGNOSIS — Z86018 Personal history of other benign neoplasm: Secondary | ICD-10-CM | POA: Diagnosis not present

## 2020-10-16 DIAGNOSIS — L720 Epidermal cyst: Secondary | ICD-10-CM | POA: Diagnosis not present

## 2020-10-22 ENCOUNTER — Encounter: Payer: Self-pay | Admitting: Dermatology

## 2020-10-22 NOTE — Progress Notes (Signed)
   New Patient   Subjective  Raymond Bryant is a 40 y.o. male who presents for the following: New Patient (Initial Visit) (Patient here today for yearly skin check per patient he has a mole on his left forearm x 2 months that looks different. 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 No personal history of non mole skin cancer. No family history of atypical moles, melanoma or non mole skin cancer. ).  General skin examination Location:  Duration:  Quality:  Associated Signs/Symptoms: Modifying Factors:  Severity:  Timing: Context: History of melanoma in situ   The following portions of the chart were reviewed this encounter and updated as appropriate:  Tobacco  Allergies  Meds  Problems  Med Hx  Surg Hx  Fam Hx       Objective  Well appearing patient in no apparent distress; mood and affect are within normal limits. Trunk 1 mm smooth dermal red papules  Right Medial [place] Half millimeter epidermal white papule.  Right Upper Back Well healed excision site with no residual pigmentation.  Torso - Posterior (Back) General skin examination, no atypical pigmented lesions.    A full examination was performed including scalp, head, eyes, ears, nose, lips, neck, chest, axillae, abdomen, back, buttocks, bilateral upper extremities, bilateral lower extremities, hands, feet, fingers, toes, fingernails, and toenails. All findings within normal limits unless otherwise noted below.   Assessment & Plan  Hemangioma of skin Trunk  No intervention necessary  Milia Right Medial [place]  Patient states that this spot does not bother him but I explained the difficulty of trying to I&D a milium in this location.  If he truly wants removal, he will schedule a half surgical visit so I can do local anesthesia.  History of melanoma in situ Right Upper Back  Recheck as needed change.  Encounter for screening for malignant neoplasm of skin Torso -  Posterior (Back)  Annual skin examination.  Encouraged to self examine with spouse twice annually.  Continue ultraviolet protection.

## 2020-10-24 ENCOUNTER — Other Ambulatory Visit: Payer: Self-pay

## 2020-10-25 ENCOUNTER — Encounter: Payer: Self-pay | Admitting: Family Medicine

## 2020-10-25 ENCOUNTER — Ambulatory Visit (INDEPENDENT_AMBULATORY_CARE_PROVIDER_SITE_OTHER): Payer: 59 | Admitting: Family Medicine

## 2020-10-25 VITALS — BP 102/78 | HR 76 | Temp 98.5°F | Ht 71.5 in | Wt 166.0 lb

## 2020-10-25 DIAGNOSIS — Z0000 Encounter for general adult medical examination without abnormal findings: Secondary | ICD-10-CM

## 2020-10-25 LAB — CBC WITH DIFFERENTIAL/PLATELET
Basophils Absolute: 0.1 K/uL (ref 0.0–0.1)
Basophils Relative: 1.9 % (ref 0.0–3.0)
Eosinophils Absolute: 0.4 K/uL (ref 0.0–0.7)
Eosinophils Relative: 6.7 % — ABNORMAL HIGH (ref 0.0–5.0)
HCT: 45.7 % (ref 39.0–52.0)
Hemoglobin: 15.5 g/dL (ref 13.0–17.0)
Lymphocytes Relative: 37.6 % (ref 12.0–46.0)
Lymphs Abs: 2 K/uL (ref 0.7–4.0)
MCHC: 33.8 g/dL (ref 30.0–36.0)
MCV: 87.2 fl (ref 78.0–100.0)
Monocytes Absolute: 0.5 K/uL (ref 0.1–1.0)
Monocytes Relative: 8.9 % (ref 3.0–12.0)
Neutro Abs: 2.4 K/uL (ref 1.4–7.7)
Neutrophils Relative %: 44.9 % (ref 43.0–77.0)
Platelets: 283 K/uL (ref 150.0–400.0)
RBC: 5.24 Mil/uL (ref 4.22–5.81)
RDW: 13 % (ref 11.5–15.5)
WBC: 5.3 K/uL (ref 4.0–10.5)

## 2020-10-25 LAB — LIPID PANEL
Cholesterol: 198 mg/dL (ref 0–200)
HDL: 50 mg/dL (ref >39.00)
LDL Cholesterol: 130 mg/dL — ABNORMAL HIGH (ref 0–99)
NonHDL: 147.55
Total CHOL/HDL Ratio: 4
Triglycerides: 90 mg/dL (ref 0.0–149.0)
VLDL: 18 mg/dL (ref 0.0–40.0)

## 2020-10-25 LAB — BASIC METABOLIC PANEL
BUN: 13 mg/dL (ref 6–23)
CO2: 32 mEq/L (ref 19–32)
Calcium: 9.7 mg/dL (ref 8.4–10.5)
Chloride: 99 mEq/L (ref 96–112)
Creatinine, Ser: 0.88 mg/dL (ref 0.40–1.50)
GFR: 106.8 mL/min (ref >60.00)
Glucose, Bld: 90 mg/dL (ref 70–99)
Potassium: 4.3 mEq/L (ref 3.5–5.1)
Sodium: 139 mEq/L (ref 135–145)

## 2020-10-25 LAB — T4, FREE: Free T4: 0.82 ng/dL (ref 0.60–1.60)

## 2020-10-25 LAB — HEPATIC FUNCTION PANEL
ALT: 15 U/L (ref 0–53)
AST: 16 U/L (ref 0–37)
Albumin: 4.7 g/dL (ref 3.5–5.2)
Alkaline Phosphatase: 37 U/L — ABNORMAL LOW (ref 39–117)
Bilirubin, Direct: 0.1 mg/dL (ref 0.0–0.3)
Total Bilirubin: 0.7 mg/dL (ref 0.2–1.2)
Total Protein: 7.4 g/dL (ref 6.0–8.3)

## 2020-10-25 LAB — TSH: TSH: 2.16 u[IU]/mL (ref 0.35–4.50)

## 2020-10-25 LAB — HEMOGLOBIN A1C: Hgb A1c MFr Bld: 5.7 % (ref 4.6–6.5)

## 2020-10-25 LAB — T3, FREE: T3, Free: 3.6 pg/mL (ref 2.3–4.2)

## 2020-10-25 LAB — PSA: PSA: 0.53 ng/mL (ref 0.10–4.00)

## 2020-10-25 NOTE — Progress Notes (Signed)
   Subjective:    Patient ID: Raymond Bryant, male    DOB: [birth year], 40 y.o.   MRN: [id number]  HPI Here for a well exam. He feels fine. He takes psyllium every day to keep his stools soft so the hemorrhoids don't flair up.    Review of Systems  Constitutional: Negative.   HENT: Negative.    Eyes: Negative.   Respiratory: Negative.    Cardiovascular: Negative.   Gastrointestinal: Negative.   Genitourinary: Negative.   Musculoskeletal: Negative.   Skin: Negative.   Neurological: Negative.   Psychiatric/Behavioral: Negative.        Objective:   Physical Exam Constitutional:      General: He is not in acute distress.    Appearance: Normal appearance. He is well-developed. He is not diaphoretic.  HENT:     Head: Normocephalic and atraumatic.     Right Ear: External ear normal.     Left Ear: External ear normal.     Nose: Nose normal.     Mouth/Throat:     Pharynx: No oropharyngeal exudate.  Eyes:     General: No scleral icterus.       Right eye: No discharge.        Left eye: No discharge.     Conjunctiva/sclera: Conjunctivae normal.     Pupils: Pupils are equal, round, and reactive to light.  Neck:     Thyroid: No thyromegaly.     Vascular: No JVD.     Trachea: No tracheal deviation.  Cardiovascular:     Rate and Rhythm: Normal rate and regular rhythm.     Heart sounds: Normal heart sounds. No murmur heard.   No friction rub. No gallop.  Pulmonary:     Effort: Pulmonary effort is normal. No respiratory distress.     Breath sounds: Normal breath sounds. No wheezing or rales.  Chest:     Chest wall: No tenderness.  Abdominal:     General: Bowel sounds are normal. There is no distension.     Palpations: Abdomen is soft. There is no mass.     Tenderness: There is no abdominal tenderness. There is no guarding or rebound.  Genitourinary:    Penis: Normal. No tenderness.      Testes: Normal.     Prostate: Normal.     Rectum: Normal. Guaiac result negative.   Musculoskeletal:        General: No tenderness. Normal range of motion.     Cervical back: Neck supple.  Lymphadenopathy:     Cervical: No cervical adenopathy.  Skin:    General: Skin is warm and dry.     Coloration: Skin is not pale.     Findings: No erythema or rash.  Neurological:     Mental Status: He is alert and oriented to person, place, and time.     Cranial Nerves: No cranial nerve deficit.     Motor: No abnormal muscle tone.     Coordination: Coordination normal.     Deep Tendon Reflexes: Reflexes are normal and symmetric. Reflexes normal.  Psychiatric:        Behavior: Behavior normal.        Thought Content: Thought content normal.        Judgment: Judgment normal.          Assessment & Plan:  Well exam. We discussed diet and exercise. Get fasting labs.  Alysia Penna, MD

## 2020-11-22 DIAGNOSIS — J301 Allergic rhinitis due to pollen: Secondary | ICD-10-CM | POA: Diagnosis not present

## 2020-11-22 DIAGNOSIS — J3081 Allergic rhinitis due to animal (cat) (dog) hair and dander: Secondary | ICD-10-CM | POA: Diagnosis not present

## 2020-11-22 DIAGNOSIS — J3089 Other allergic rhinitis: Secondary | ICD-10-CM | POA: Diagnosis not present

## 2021-01-21 DIAGNOSIS — J301 Allergic rhinitis due to pollen: Secondary | ICD-10-CM | POA: Diagnosis not present

## 2021-01-22 DIAGNOSIS — J3081 Allergic rhinitis due to animal (cat) (dog) hair and dander: Secondary | ICD-10-CM | POA: Diagnosis not present

## 2021-01-22 DIAGNOSIS — J3089 Other allergic rhinitis: Secondary | ICD-10-CM | POA: Diagnosis not present

## 2021-01-28 DIAGNOSIS — J3081 Allergic rhinitis due to animal (cat) (dog) hair and dander: Secondary | ICD-10-CM | POA: Diagnosis not present

## 2021-01-28 DIAGNOSIS — J301 Allergic rhinitis due to pollen: Secondary | ICD-10-CM | POA: Diagnosis not present

## 2021-01-28 DIAGNOSIS — J3089 Other allergic rhinitis: Secondary | ICD-10-CM | POA: Diagnosis not present

## 2021-01-31 DIAGNOSIS — J3081 Allergic rhinitis due to animal (cat) (dog) hair and dander: Secondary | ICD-10-CM | POA: Diagnosis not present

## 2021-01-31 DIAGNOSIS — J3089 Other allergic rhinitis: Secondary | ICD-10-CM | POA: Diagnosis not present

## 2021-01-31 DIAGNOSIS — J301 Allergic rhinitis due to pollen: Secondary | ICD-10-CM | POA: Diagnosis not present

## 2021-02-08 DIAGNOSIS — J3081 Allergic rhinitis due to animal (cat) (dog) hair and dander: Secondary | ICD-10-CM | POA: Diagnosis not present

## 2021-02-08 DIAGNOSIS — J301 Allergic rhinitis due to pollen: Secondary | ICD-10-CM | POA: Diagnosis not present

## 2021-02-08 DIAGNOSIS — J3089 Other allergic rhinitis: Secondary | ICD-10-CM | POA: Diagnosis not present

## 2021-02-13 DIAGNOSIS — J3081 Allergic rhinitis due to animal (cat) (dog) hair and dander: Secondary | ICD-10-CM | POA: Diagnosis not present

## 2021-02-13 DIAGNOSIS — J301 Allergic rhinitis due to pollen: Secondary | ICD-10-CM | POA: Diagnosis not present

## 2021-02-13 DIAGNOSIS — J3089 Other allergic rhinitis: Secondary | ICD-10-CM | POA: Diagnosis not present

## 2021-02-22 DIAGNOSIS — J3089 Other allergic rhinitis: Secondary | ICD-10-CM | POA: Diagnosis not present

## 2021-02-22 DIAGNOSIS — J301 Allergic rhinitis due to pollen: Secondary | ICD-10-CM | POA: Diagnosis not present

## 2021-02-22 DIAGNOSIS — J3081 Allergic rhinitis due to animal (cat) (dog) hair and dander: Secondary | ICD-10-CM | POA: Diagnosis not present

## 2021-02-26 DIAGNOSIS — J301 Allergic rhinitis due to pollen: Secondary | ICD-10-CM | POA: Diagnosis not present

## 2021-02-26 DIAGNOSIS — J3081 Allergic rhinitis due to animal (cat) (dog) hair and dander: Secondary | ICD-10-CM | POA: Diagnosis not present

## 2021-02-26 DIAGNOSIS — J3089 Other allergic rhinitis: Secondary | ICD-10-CM | POA: Diagnosis not present

## 2021-03-08 DIAGNOSIS — J3081 Allergic rhinitis due to animal (cat) (dog) hair and dander: Secondary | ICD-10-CM | POA: Diagnosis not present

## 2021-03-08 DIAGNOSIS — J3089 Other allergic rhinitis: Secondary | ICD-10-CM | POA: Diagnosis not present

## 2021-03-08 DIAGNOSIS — J301 Allergic rhinitis due to pollen: Secondary | ICD-10-CM | POA: Diagnosis not present

## 2021-03-18 DIAGNOSIS — J3081 Allergic rhinitis due to animal (cat) (dog) hair and dander: Secondary | ICD-10-CM | POA: Diagnosis not present

## 2021-03-18 DIAGNOSIS — J301 Allergic rhinitis due to pollen: Secondary | ICD-10-CM | POA: Diagnosis not present

## 2021-03-18 DIAGNOSIS — J3089 Other allergic rhinitis: Secondary | ICD-10-CM | POA: Diagnosis not present

## 2021-03-22 DIAGNOSIS — J3089 Other allergic rhinitis: Secondary | ICD-10-CM | POA: Diagnosis not present

## 2021-03-22 DIAGNOSIS — J3081 Allergic rhinitis due to animal (cat) (dog) hair and dander: Secondary | ICD-10-CM | POA: Diagnosis not present

## 2021-03-22 DIAGNOSIS — J301 Allergic rhinitis due to pollen: Secondary | ICD-10-CM | POA: Diagnosis not present

## 2021-04-03 DIAGNOSIS — J3081 Allergic rhinitis due to animal (cat) (dog) hair and dander: Secondary | ICD-10-CM | POA: Diagnosis not present

## 2021-04-03 DIAGNOSIS — J3089 Other allergic rhinitis: Secondary | ICD-10-CM | POA: Diagnosis not present

## 2021-04-03 DIAGNOSIS — J301 Allergic rhinitis due to pollen: Secondary | ICD-10-CM | POA: Diagnosis not present

## 2021-04-24 DIAGNOSIS — J301 Allergic rhinitis due to pollen: Secondary | ICD-10-CM | POA: Diagnosis not present

## 2021-04-24 DIAGNOSIS — J3089 Other allergic rhinitis: Secondary | ICD-10-CM | POA: Diagnosis not present

## 2021-04-24 DIAGNOSIS — J3081 Allergic rhinitis due to animal (cat) (dog) hair and dander: Secondary | ICD-10-CM | POA: Diagnosis not present

## 2021-04-26 DIAGNOSIS — J301 Allergic rhinitis due to pollen: Secondary | ICD-10-CM | POA: Diagnosis not present

## 2021-04-26 DIAGNOSIS — J3089 Other allergic rhinitis: Secondary | ICD-10-CM | POA: Diagnosis not present

## 2021-04-26 DIAGNOSIS — J3081 Allergic rhinitis due to animal (cat) (dog) hair and dander: Secondary | ICD-10-CM | POA: Diagnosis not present

## 2021-04-30 ENCOUNTER — Other Ambulatory Visit (HOSPITAL_COMMUNITY): Payer: Self-pay

## 2021-04-30 DIAGNOSIS — J301 Allergic rhinitis due to pollen: Secondary | ICD-10-CM | POA: Diagnosis not present

## 2021-04-30 DIAGNOSIS — J3089 Other allergic rhinitis: Secondary | ICD-10-CM | POA: Diagnosis not present

## 2021-04-30 DIAGNOSIS — J4520 Mild intermittent asthma, uncomplicated: Secondary | ICD-10-CM | POA: Diagnosis not present

## 2021-04-30 DIAGNOSIS — Z91010 Allergy to peanuts: Secondary | ICD-10-CM | POA: Diagnosis not present

## 2021-04-30 DIAGNOSIS — J3081 Allergic rhinitis due to animal (cat) (dog) hair and dander: Secondary | ICD-10-CM | POA: Diagnosis not present

## 2021-04-30 MED ORDER — EPINEPHRINE 0.3 MG/0.3ML IJ SOAJ
INTRAMUSCULAR | 1 refills | Status: AC
  Filled 2021-04-30: qty 4, 60d supply, fill #0

## 2021-04-30 MED ORDER — ALBUTEROL SULFATE HFA 108 (90 BASE) MCG/ACT IN AERS
INHALATION_SPRAY | RESPIRATORY_TRACT | 0 refills | Status: AC
  Filled 2021-04-30: qty 18, 16d supply, fill #0

## 2021-05-01 ENCOUNTER — Other Ambulatory Visit (HOSPITAL_COMMUNITY): Payer: Self-pay

## 2021-05-15 DIAGNOSIS — J301 Allergic rhinitis due to pollen: Secondary | ICD-10-CM | POA: Diagnosis not present

## 2021-05-15 DIAGNOSIS — J3089 Other allergic rhinitis: Secondary | ICD-10-CM | POA: Diagnosis not present

## 2021-05-15 DIAGNOSIS — J3081 Allergic rhinitis due to animal (cat) (dog) hair and dander: Secondary | ICD-10-CM | POA: Diagnosis not present

## 2021-05-28 DIAGNOSIS — J3089 Other allergic rhinitis: Secondary | ICD-10-CM | POA: Diagnosis not present

## 2021-05-28 DIAGNOSIS — J301 Allergic rhinitis due to pollen: Secondary | ICD-10-CM | POA: Diagnosis not present

## 2021-05-28 DIAGNOSIS — J3081 Allergic rhinitis due to animal (cat) (dog) hair and dander: Secondary | ICD-10-CM | POA: Diagnosis not present

## 2021-05-31 DIAGNOSIS — J301 Allergic rhinitis due to pollen: Secondary | ICD-10-CM | POA: Diagnosis not present

## 2021-05-31 DIAGNOSIS — J3089 Other allergic rhinitis: Secondary | ICD-10-CM | POA: Diagnosis not present

## 2021-05-31 DIAGNOSIS — J3081 Allergic rhinitis due to animal (cat) (dog) hair and dander: Secondary | ICD-10-CM | POA: Diagnosis not present

## 2021-06-04 DIAGNOSIS — J3081 Allergic rhinitis due to animal (cat) (dog) hair and dander: Secondary | ICD-10-CM | POA: Diagnosis not present

## 2021-06-04 DIAGNOSIS — J3089 Other allergic rhinitis: Secondary | ICD-10-CM | POA: Diagnosis not present

## 2021-06-04 DIAGNOSIS — J301 Allergic rhinitis due to pollen: Secondary | ICD-10-CM | POA: Diagnosis not present

## 2021-06-20 DIAGNOSIS — J3081 Allergic rhinitis due to animal (cat) (dog) hair and dander: Secondary | ICD-10-CM | POA: Diagnosis not present

## 2021-06-20 DIAGNOSIS — J301 Allergic rhinitis due to pollen: Secondary | ICD-10-CM | POA: Diagnosis not present

## 2021-06-20 DIAGNOSIS — J3089 Other allergic rhinitis: Secondary | ICD-10-CM | POA: Diagnosis not present

## 2021-06-28 DIAGNOSIS — J3089 Other allergic rhinitis: Secondary | ICD-10-CM | POA: Diagnosis not present

## 2021-06-28 DIAGNOSIS — J301 Allergic rhinitis due to pollen: Secondary | ICD-10-CM | POA: Diagnosis not present

## 2021-06-28 DIAGNOSIS — J3081 Allergic rhinitis due to animal (cat) (dog) hair and dander: Secondary | ICD-10-CM | POA: Diagnosis not present

## 2021-07-03 DIAGNOSIS — J301 Allergic rhinitis due to pollen: Secondary | ICD-10-CM | POA: Diagnosis not present

## 2021-07-03 DIAGNOSIS — J3081 Allergic rhinitis due to animal (cat) (dog) hair and dander: Secondary | ICD-10-CM | POA: Diagnosis not present

## 2021-07-03 DIAGNOSIS — J3089 Other allergic rhinitis: Secondary | ICD-10-CM | POA: Diagnosis not present

## 2021-07-11 DIAGNOSIS — J301 Allergic rhinitis due to pollen: Secondary | ICD-10-CM | POA: Diagnosis not present

## 2021-07-11 DIAGNOSIS — J3081 Allergic rhinitis due to animal (cat) (dog) hair and dander: Secondary | ICD-10-CM | POA: Diagnosis not present

## 2021-07-11 DIAGNOSIS — J3089 Other allergic rhinitis: Secondary | ICD-10-CM | POA: Diagnosis not present

## 2021-07-18 DIAGNOSIS — J3081 Allergic rhinitis due to animal (cat) (dog) hair and dander: Secondary | ICD-10-CM | POA: Diagnosis not present

## 2021-07-18 DIAGNOSIS — J301 Allergic rhinitis due to pollen: Secondary | ICD-10-CM | POA: Diagnosis not present

## 2021-07-18 DIAGNOSIS — J3089 Other allergic rhinitis: Secondary | ICD-10-CM | POA: Diagnosis not present

## 2021-07-22 DIAGNOSIS — J301 Allergic rhinitis due to pollen: Secondary | ICD-10-CM | POA: Diagnosis not present

## 2021-07-22 DIAGNOSIS — J3081 Allergic rhinitis due to animal (cat) (dog) hair and dander: Secondary | ICD-10-CM | POA: Diagnosis not present

## 2021-07-22 DIAGNOSIS — J3089 Other allergic rhinitis: Secondary | ICD-10-CM | POA: Diagnosis not present

## 2021-08-01 DIAGNOSIS — J3081 Allergic rhinitis due to animal (cat) (dog) hair and dander: Secondary | ICD-10-CM | POA: Diagnosis not present

## 2021-08-01 DIAGNOSIS — J3089 Other allergic rhinitis: Secondary | ICD-10-CM | POA: Diagnosis not present

## 2021-08-01 DIAGNOSIS — J301 Allergic rhinitis due to pollen: Secondary | ICD-10-CM | POA: Diagnosis not present

## 2021-08-06 DIAGNOSIS — J3081 Allergic rhinitis due to animal (cat) (dog) hair and dander: Secondary | ICD-10-CM | POA: Diagnosis not present

## 2021-08-06 DIAGNOSIS — J3089 Other allergic rhinitis: Secondary | ICD-10-CM | POA: Diagnosis not present

## 2021-08-06 DIAGNOSIS — J301 Allergic rhinitis due to pollen: Secondary | ICD-10-CM | POA: Diagnosis not present

## 2021-08-09 DIAGNOSIS — J3081 Allergic rhinitis due to animal (cat) (dog) hair and dander: Secondary | ICD-10-CM | POA: Diagnosis not present

## 2021-08-09 DIAGNOSIS — J3089 Other allergic rhinitis: Secondary | ICD-10-CM | POA: Diagnosis not present

## 2021-08-09 DIAGNOSIS — J301 Allergic rhinitis due to pollen: Secondary | ICD-10-CM | POA: Diagnosis not present

## 2021-08-15 DIAGNOSIS — J3081 Allergic rhinitis due to animal (cat) (dog) hair and dander: Secondary | ICD-10-CM | POA: Diagnosis not present

## 2021-08-15 DIAGNOSIS — J3089 Other allergic rhinitis: Secondary | ICD-10-CM | POA: Diagnosis not present

## 2021-08-15 DIAGNOSIS — J301 Allergic rhinitis due to pollen: Secondary | ICD-10-CM | POA: Diagnosis not present

## 2021-08-20 DIAGNOSIS — J301 Allergic rhinitis due to pollen: Secondary | ICD-10-CM | POA: Diagnosis not present

## 2021-08-20 DIAGNOSIS — J3081 Allergic rhinitis due to animal (cat) (dog) hair and dander: Secondary | ICD-10-CM | POA: Diagnosis not present

## 2021-08-20 DIAGNOSIS — J3089 Other allergic rhinitis: Secondary | ICD-10-CM | POA: Diagnosis not present

## 2021-08-23 DIAGNOSIS — J3089 Other allergic rhinitis: Secondary | ICD-10-CM | POA: Diagnosis not present

## 2021-08-23 DIAGNOSIS — J301 Allergic rhinitis due to pollen: Secondary | ICD-10-CM | POA: Diagnosis not present

## 2021-08-23 DIAGNOSIS — J3081 Allergic rhinitis due to animal (cat) (dog) hair and dander: Secondary | ICD-10-CM | POA: Diagnosis not present

## 2021-08-29 DIAGNOSIS — J3081 Allergic rhinitis due to animal (cat) (dog) hair and dander: Secondary | ICD-10-CM | POA: Diagnosis not present

## 2021-08-29 DIAGNOSIS — J3089 Other allergic rhinitis: Secondary | ICD-10-CM | POA: Diagnosis not present

## 2021-08-29 DIAGNOSIS — J301 Allergic rhinitis due to pollen: Secondary | ICD-10-CM | POA: Diagnosis not present

## 2021-09-03 DIAGNOSIS — J3081 Allergic rhinitis due to animal (cat) (dog) hair and dander: Secondary | ICD-10-CM | POA: Diagnosis not present

## 2021-09-03 DIAGNOSIS — J3089 Other allergic rhinitis: Secondary | ICD-10-CM | POA: Diagnosis not present

## 2021-09-03 DIAGNOSIS — J301 Allergic rhinitis due to pollen: Secondary | ICD-10-CM | POA: Diagnosis not present

## 2021-09-16 DIAGNOSIS — J3081 Allergic rhinitis due to animal (cat) (dog) hair and dander: Secondary | ICD-10-CM | POA: Diagnosis not present

## 2021-09-16 DIAGNOSIS — J301 Allergic rhinitis due to pollen: Secondary | ICD-10-CM | POA: Diagnosis not present

## 2021-09-16 DIAGNOSIS — J3089 Other allergic rhinitis: Secondary | ICD-10-CM | POA: Diagnosis not present

## 2021-09-30 DIAGNOSIS — J3089 Other allergic rhinitis: Secondary | ICD-10-CM | POA: Diagnosis not present

## 2021-09-30 DIAGNOSIS — J3081 Allergic rhinitis due to animal (cat) (dog) hair and dander: Secondary | ICD-10-CM | POA: Diagnosis not present

## 2021-09-30 DIAGNOSIS — J301 Allergic rhinitis due to pollen: Secondary | ICD-10-CM | POA: Diagnosis not present

## 2021-10-16 ENCOUNTER — Ambulatory Visit: Payer: 59 | Admitting: Dermatology

## 2021-10-23 DIAGNOSIS — J3081 Allergic rhinitis due to animal (cat) (dog) hair and dander: Secondary | ICD-10-CM | POA: Diagnosis not present

## 2021-10-23 DIAGNOSIS — J301 Allergic rhinitis due to pollen: Secondary | ICD-10-CM | POA: Diagnosis not present

## 2021-10-23 DIAGNOSIS — J3089 Other allergic rhinitis: Secondary | ICD-10-CM | POA: Diagnosis not present

## 2021-10-29 DIAGNOSIS — H524 Presbyopia: Secondary | ICD-10-CM | POA: Diagnosis not present

## 2021-10-29 DIAGNOSIS — H5203 Hypermetropia, bilateral: Secondary | ICD-10-CM | POA: Diagnosis not present

## 2021-10-29 DIAGNOSIS — J3089 Other allergic rhinitis: Secondary | ICD-10-CM | POA: Diagnosis not present

## 2021-10-29 DIAGNOSIS — J3081 Allergic rhinitis due to animal (cat) (dog) hair and dander: Secondary | ICD-10-CM | POA: Diagnosis not present

## 2021-10-29 DIAGNOSIS — J301 Allergic rhinitis due to pollen: Secondary | ICD-10-CM | POA: Diagnosis not present

## 2021-11-01 DIAGNOSIS — J301 Allergic rhinitis due to pollen: Secondary | ICD-10-CM | POA: Diagnosis not present

## 2021-11-01 DIAGNOSIS — J3081 Allergic rhinitis due to animal (cat) (dog) hair and dander: Secondary | ICD-10-CM | POA: Diagnosis not present

## 2021-11-01 DIAGNOSIS — J3089 Other allergic rhinitis: Secondary | ICD-10-CM | POA: Diagnosis not present

## 2021-11-04 DIAGNOSIS — J301 Allergic rhinitis due to pollen: Secondary | ICD-10-CM | POA: Diagnosis not present

## 2021-11-05 DIAGNOSIS — J3081 Allergic rhinitis due to animal (cat) (dog) hair and dander: Secondary | ICD-10-CM | POA: Diagnosis not present

## 2021-11-05 DIAGNOSIS — J3089 Other allergic rhinitis: Secondary | ICD-10-CM | POA: Diagnosis not present

## 2021-11-08 DIAGNOSIS — J301 Allergic rhinitis due to pollen: Secondary | ICD-10-CM | POA: Diagnosis not present

## 2021-11-08 DIAGNOSIS — J3081 Allergic rhinitis due to animal (cat) (dog) hair and dander: Secondary | ICD-10-CM | POA: Diagnosis not present

## 2021-11-08 DIAGNOSIS — J3089 Other allergic rhinitis: Secondary | ICD-10-CM | POA: Diagnosis not present

## 2021-11-20 DIAGNOSIS — J301 Allergic rhinitis due to pollen: Secondary | ICD-10-CM | POA: Diagnosis not present

## 2021-11-20 DIAGNOSIS — J3089 Other allergic rhinitis: Secondary | ICD-10-CM | POA: Diagnosis not present

## 2021-11-20 DIAGNOSIS — J3081 Allergic rhinitis due to animal (cat) (dog) hair and dander: Secondary | ICD-10-CM | POA: Diagnosis not present

## 2021-12-03 DIAGNOSIS — J301 Allergic rhinitis due to pollen: Secondary | ICD-10-CM | POA: Diagnosis not present

## 2021-12-03 DIAGNOSIS — J3089 Other allergic rhinitis: Secondary | ICD-10-CM | POA: Diagnosis not present

## 2021-12-03 DIAGNOSIS — J3081 Allergic rhinitis due to animal (cat) (dog) hair and dander: Secondary | ICD-10-CM | POA: Diagnosis not present

## 2022-01-07 DIAGNOSIS — J3081 Allergic rhinitis due to animal (cat) (dog) hair and dander: Secondary | ICD-10-CM | POA: Diagnosis not present

## 2022-01-07 DIAGNOSIS — J301 Allergic rhinitis due to pollen: Secondary | ICD-10-CM | POA: Diagnosis not present

## 2022-01-07 DIAGNOSIS — J3089 Other allergic rhinitis: Secondary | ICD-10-CM | POA: Diagnosis not present

## 2022-01-21 DIAGNOSIS — J3089 Other allergic rhinitis: Secondary | ICD-10-CM | POA: Diagnosis not present

## 2022-01-21 DIAGNOSIS — J301 Allergic rhinitis due to pollen: Secondary | ICD-10-CM | POA: Diagnosis not present

## 2022-01-21 DIAGNOSIS — J3081 Allergic rhinitis due to animal (cat) (dog) hair and dander: Secondary | ICD-10-CM | POA: Diagnosis not present

## 2022-02-07 DIAGNOSIS — J3081 Allergic rhinitis due to animal (cat) (dog) hair and dander: Secondary | ICD-10-CM | POA: Diagnosis not present

## 2022-02-07 DIAGNOSIS — J3089 Other allergic rhinitis: Secondary | ICD-10-CM | POA: Diagnosis not present

## 2022-02-07 DIAGNOSIS — J301 Allergic rhinitis due to pollen: Secondary | ICD-10-CM | POA: Diagnosis not present

## 2022-02-27 DIAGNOSIS — J3081 Allergic rhinitis due to animal (cat) (dog) hair and dander: Secondary | ICD-10-CM | POA: Diagnosis not present

## 2022-02-27 DIAGNOSIS — J3089 Other allergic rhinitis: Secondary | ICD-10-CM | POA: Diagnosis not present

## 2022-02-27 DIAGNOSIS — J301 Allergic rhinitis due to pollen: Secondary | ICD-10-CM | POA: Diagnosis not present

## 2022-03-05 DIAGNOSIS — J3089 Other allergic rhinitis: Secondary | ICD-10-CM | POA: Diagnosis not present

## 2022-03-05 DIAGNOSIS — J301 Allergic rhinitis due to pollen: Secondary | ICD-10-CM | POA: Diagnosis not present

## 2022-03-05 DIAGNOSIS — J3081 Allergic rhinitis due to animal (cat) (dog) hair and dander: Secondary | ICD-10-CM | POA: Diagnosis not present

## 2022-03-31 DIAGNOSIS — J3081 Allergic rhinitis due to animal (cat) (dog) hair and dander: Secondary | ICD-10-CM | POA: Diagnosis not present

## 2022-03-31 DIAGNOSIS — J3089 Other allergic rhinitis: Secondary | ICD-10-CM | POA: Diagnosis not present

## 2022-03-31 DIAGNOSIS — J301 Allergic rhinitis due to pollen: Secondary | ICD-10-CM | POA: Diagnosis not present

## 2022-04-10 DIAGNOSIS — J301 Allergic rhinitis due to pollen: Secondary | ICD-10-CM | POA: Diagnosis not present

## 2022-04-10 DIAGNOSIS — J3089 Other allergic rhinitis: Secondary | ICD-10-CM | POA: Diagnosis not present

## 2022-04-10 DIAGNOSIS — J3081 Allergic rhinitis due to animal (cat) (dog) hair and dander: Secondary | ICD-10-CM | POA: Diagnosis not present

## 2022-04-21 DIAGNOSIS — J3089 Other allergic rhinitis: Secondary | ICD-10-CM | POA: Diagnosis not present

## 2022-04-21 DIAGNOSIS — J3081 Allergic rhinitis due to animal (cat) (dog) hair and dander: Secondary | ICD-10-CM | POA: Diagnosis not present

## 2022-04-21 DIAGNOSIS — J301 Allergic rhinitis due to pollen: Secondary | ICD-10-CM | POA: Diagnosis not present

## 2022-04-30 ENCOUNTER — Other Ambulatory Visit (HOSPITAL_COMMUNITY): Payer: Self-pay

## 2022-04-30 ENCOUNTER — Other Ambulatory Visit: Payer: Self-pay

## 2022-04-30 ENCOUNTER — Encounter (HOSPITAL_COMMUNITY): Payer: Self-pay

## 2022-04-30 DIAGNOSIS — J4520 Mild intermittent asthma, uncomplicated: Secondary | ICD-10-CM | POA: Diagnosis not present

## 2022-04-30 DIAGNOSIS — J3089 Other allergic rhinitis: Secondary | ICD-10-CM | POA: Diagnosis not present

## 2022-04-30 DIAGNOSIS — Z91010 Allergy to peanuts: Secondary | ICD-10-CM | POA: Diagnosis not present

## 2022-04-30 DIAGNOSIS — J301 Allergic rhinitis due to pollen: Secondary | ICD-10-CM | POA: Diagnosis not present

## 2022-04-30 MED ORDER — ALBUTEROL SULFATE HFA 108 (90 BASE) MCG/ACT IN AERS
1.0000 | INHALATION_SPRAY | RESPIRATORY_TRACT | 0 refills | Status: AC
  Filled 2022-04-30: qty 6.7, 17d supply, fill #0

## 2022-04-30 MED ORDER — EPINEPHRINE 0.3 MG/0.3ML IJ SOAJ
INTRAMUSCULAR | 1 refills | Status: AC
  Filled 2022-04-30: qty 4, 30d supply, fill #0

## 2022-05-07 ENCOUNTER — Other Ambulatory Visit: Payer: Self-pay

## 2022-05-13 DIAGNOSIS — J3089 Other allergic rhinitis: Secondary | ICD-10-CM | POA: Diagnosis not present

## 2022-05-13 DIAGNOSIS — J3081 Allergic rhinitis due to animal (cat) (dog) hair and dander: Secondary | ICD-10-CM | POA: Diagnosis not present

## 2022-05-13 DIAGNOSIS — J301 Allergic rhinitis due to pollen: Secondary | ICD-10-CM | POA: Diagnosis not present

## 2022-05-23 DIAGNOSIS — J3089 Other allergic rhinitis: Secondary | ICD-10-CM | POA: Diagnosis not present

## 2022-05-23 DIAGNOSIS — J301 Allergic rhinitis due to pollen: Secondary | ICD-10-CM | POA: Diagnosis not present

## 2022-05-23 DIAGNOSIS — J3081 Allergic rhinitis due to animal (cat) (dog) hair and dander: Secondary | ICD-10-CM | POA: Diagnosis not present

## 2022-05-30 DIAGNOSIS — J3081 Allergic rhinitis due to animal (cat) (dog) hair and dander: Secondary | ICD-10-CM | POA: Diagnosis not present

## 2022-05-30 DIAGNOSIS — J3089 Other allergic rhinitis: Secondary | ICD-10-CM | POA: Diagnosis not present

## 2022-05-30 DIAGNOSIS — J301 Allergic rhinitis due to pollen: Secondary | ICD-10-CM | POA: Diagnosis not present

## 2022-06-06 DIAGNOSIS — J3081 Allergic rhinitis due to animal (cat) (dog) hair and dander: Secondary | ICD-10-CM | POA: Diagnosis not present

## 2022-06-06 DIAGNOSIS — J3089 Other allergic rhinitis: Secondary | ICD-10-CM | POA: Diagnosis not present

## 2022-06-06 DIAGNOSIS — J301 Allergic rhinitis due to pollen: Secondary | ICD-10-CM | POA: Diagnosis not present

## 2022-06-12 DIAGNOSIS — J3089 Other allergic rhinitis: Secondary | ICD-10-CM | POA: Diagnosis not present

## 2022-06-12 DIAGNOSIS — J3081 Allergic rhinitis due to animal (cat) (dog) hair and dander: Secondary | ICD-10-CM | POA: Diagnosis not present

## 2022-06-12 DIAGNOSIS — J301 Allergic rhinitis due to pollen: Secondary | ICD-10-CM | POA: Diagnosis not present

## 2022-06-23 DIAGNOSIS — J3089 Other allergic rhinitis: Secondary | ICD-10-CM | POA: Diagnosis not present

## 2022-06-23 DIAGNOSIS — J3081 Allergic rhinitis due to animal (cat) (dog) hair and dander: Secondary | ICD-10-CM | POA: Diagnosis not present

## 2022-06-23 DIAGNOSIS — J301 Allergic rhinitis due to pollen: Secondary | ICD-10-CM | POA: Diagnosis not present

## 2022-07-04 DIAGNOSIS — J3081 Allergic rhinitis due to animal (cat) (dog) hair and dander: Secondary | ICD-10-CM | POA: Diagnosis not present

## 2022-07-04 DIAGNOSIS — J3089 Other allergic rhinitis: Secondary | ICD-10-CM | POA: Diagnosis not present

## 2022-07-04 DIAGNOSIS — J301 Allergic rhinitis due to pollen: Secondary | ICD-10-CM | POA: Diagnosis not present

## 2022-07-11 DIAGNOSIS — J3089 Other allergic rhinitis: Secondary | ICD-10-CM | POA: Diagnosis not present

## 2022-07-11 DIAGNOSIS — J3081 Allergic rhinitis due to animal (cat) (dog) hair and dander: Secondary | ICD-10-CM | POA: Diagnosis not present

## 2022-07-11 DIAGNOSIS — J301 Allergic rhinitis due to pollen: Secondary | ICD-10-CM | POA: Diagnosis not present

## 2022-07-24 DIAGNOSIS — J3081 Allergic rhinitis due to animal (cat) (dog) hair and dander: Secondary | ICD-10-CM | POA: Diagnosis not present

## 2022-07-24 DIAGNOSIS — J3089 Other allergic rhinitis: Secondary | ICD-10-CM | POA: Diagnosis not present

## 2022-07-24 DIAGNOSIS — J301 Allergic rhinitis due to pollen: Secondary | ICD-10-CM | POA: Diagnosis not present

## 2022-08-11 DIAGNOSIS — J3089 Other allergic rhinitis: Secondary | ICD-10-CM | POA: Diagnosis not present

## 2022-08-11 DIAGNOSIS — J301 Allergic rhinitis due to pollen: Secondary | ICD-10-CM | POA: Diagnosis not present

## 2022-08-11 DIAGNOSIS — J3081 Allergic rhinitis due to animal (cat) (dog) hair and dander: Secondary | ICD-10-CM | POA: Diagnosis not present

## 2022-08-26 DIAGNOSIS — J3081 Allergic rhinitis due to animal (cat) (dog) hair and dander: Secondary | ICD-10-CM | POA: Diagnosis not present

## 2022-08-26 DIAGNOSIS — J3089 Other allergic rhinitis: Secondary | ICD-10-CM | POA: Diagnosis not present

## 2022-08-26 DIAGNOSIS — J301 Allergic rhinitis due to pollen: Secondary | ICD-10-CM | POA: Diagnosis not present

## 2022-09-08 ENCOUNTER — Other Ambulatory Visit: Payer: Self-pay

## 2022-09-08 ENCOUNTER — Other Ambulatory Visit (HOSPITAL_COMMUNITY): Payer: Self-pay

## 2022-09-08 DIAGNOSIS — J3089 Other allergic rhinitis: Secondary | ICD-10-CM | POA: Diagnosis not present

## 2022-09-08 DIAGNOSIS — J301 Allergic rhinitis due to pollen: Secondary | ICD-10-CM | POA: Diagnosis not present

## 2022-09-08 DIAGNOSIS — J3081 Allergic rhinitis due to animal (cat) (dog) hair and dander: Secondary | ICD-10-CM | POA: Diagnosis not present

## 2022-09-08 MED ORDER — EPINEPHRINE 0.3 MG/0.3ML IJ SOAJ
0.3000 mg | INTRAMUSCULAR | 1 refills | Status: AC | PRN
  Filled 2022-09-08: qty 4, 60d supply, fill #0

## 2022-09-09 ENCOUNTER — Other Ambulatory Visit: Payer: Self-pay

## 2022-09-09 ENCOUNTER — Encounter: Payer: Self-pay | Admitting: Pharmacist

## 2022-09-11 ENCOUNTER — Other Ambulatory Visit: Payer: Self-pay

## 2022-10-30 ENCOUNTER — Other Ambulatory Visit (HOSPITAL_COMMUNITY): Payer: Self-pay

## 2022-10-30 MED ORDER — EPINEPHRINE 0.3 MG/0.3ML IJ SOAJ
INTRAMUSCULAR | 1 refills | Status: AC
  Filled 2022-10-30: qty 4, 60d supply, fill #0

## 2022-10-31 ENCOUNTER — Other Ambulatory Visit (HOSPITAL_COMMUNITY): Payer: Self-pay

## 2022-10-31 ENCOUNTER — Other Ambulatory Visit: Payer: Self-pay

## 2022-11-04 ENCOUNTER — Other Ambulatory Visit (HOSPITAL_COMMUNITY): Payer: Self-pay

## 2022-11-19 ENCOUNTER — Other Ambulatory Visit: Payer: Self-pay

## 2022-11-19 ENCOUNTER — Other Ambulatory Visit (HOSPITAL_COMMUNITY): Payer: Self-pay

## 2022-11-19 MED ORDER — ALBUTEROL SULFATE HFA 108 (90 BASE) MCG/ACT IN AERS
INHALATION_SPRAY | RESPIRATORY_TRACT | 0 refills | Status: DC
  Filled 2022-11-19: qty 6.7, 17d supply, fill #0

## 2023-05-20 ENCOUNTER — Telehealth: Payer: Self-pay | Admitting: Family Medicine

## 2023-05-20 NOTE — Telephone Encounter (Signed)
 LM for pt to call if appointment is needed

## 2023-07-29 ENCOUNTER — Other Ambulatory Visit (HOSPITAL_COMMUNITY): Payer: Self-pay

## 2023-07-30 ENCOUNTER — Other Ambulatory Visit (HOSPITAL_COMMUNITY): Payer: Self-pay

## 2023-07-30 ENCOUNTER — Other Ambulatory Visit: Payer: Self-pay

## 2023-07-30 MED ORDER — ALBUTEROL SULFATE HFA 108 (90 BASE) MCG/ACT IN AERS
1.0000 | INHALATION_SPRAY | RESPIRATORY_TRACT | 0 refills | Status: AC | PRN
  Filled 2023-07-30: qty 6.7, 17d supply, fill #0

## 2023-08-04 ENCOUNTER — Other Ambulatory Visit (HOSPITAL_COMMUNITY): Payer: Self-pay

## 2024-03-29 ENCOUNTER — Other Ambulatory Visit: Payer: Self-pay

## 2024-03-29 ENCOUNTER — Other Ambulatory Visit (HOSPITAL_COMMUNITY): Payer: Self-pay

## 2024-03-29 MED ORDER — EPINEPHRINE 0.3 MG/0.3ML IJ SOAJ
INTRAMUSCULAR | 1 refills | Status: AC
  Filled 2024-03-29: qty 2, 30d supply, fill #0

## 2024-03-30 ENCOUNTER — Other Ambulatory Visit: Payer: Self-pay

## 2024-03-30 ENCOUNTER — Encounter: Payer: Self-pay | Admitting: Pharmacist

## 2024-04-04 ENCOUNTER — Other Ambulatory Visit: Payer: Self-pay

## 2024-05-24 ENCOUNTER — Other Ambulatory Visit: Payer: Self-pay

## 2024-05-24 ENCOUNTER — Other Ambulatory Visit (HOSPITAL_COMMUNITY): Payer: Self-pay

## 2024-05-24 MED ORDER — EPINEPHRINE 0.3 MG/0.3ML IJ SOAJ
0.3000 mg | INTRAMUSCULAR | 1 refills | Status: AC | PRN
  Filled 2024-05-24: qty 2, 30d supply, fill #0
